# Patient Record
Sex: Female | Born: 1975 | Race: White | Hispanic: No | Marital: Married | Smoking: Never smoker
Health system: Southern US, Community
[De-identification: ages and names within clinical notes are randomized; demographics above are authoritative.]

## PROBLEM LIST (undated history)

## (undated) DIAGNOSIS — F419 Anxiety disorder, unspecified: Secondary | ICD-10-CM

## (undated) DIAGNOSIS — I10 Essential (primary) hypertension: Secondary | ICD-10-CM

## (undated) DIAGNOSIS — M797 Fibromyalgia: Secondary | ICD-10-CM

## (undated) DIAGNOSIS — K529 Noninfective gastroenteritis and colitis, unspecified: Secondary | ICD-10-CM

## (undated) HISTORY — DX: Anxiety disorder, unspecified: F41.9

## (undated) HISTORY — DX: Fibromyalgia: M79.7

## (undated) HISTORY — PX: CHOLECYSTECTOMY: SHX55

## (undated) HISTORY — PX: PATELLA FRACTURE SURGERY: SHX735

## (undated) HISTORY — PX: BREAST SURGERY: SHX581

## (undated) HISTORY — DX: Essential (primary) hypertension: I10

## (undated) HISTORY — DX: Noninfective gastroenteritis and colitis, unspecified: K52.9

---

## 2002-09-25 ENCOUNTER — Other Ambulatory Visit: Admission: RE | Admit: 2002-09-25 | Discharge: 2002-09-25 | Payer: Self-pay | Admitting: Obstetrics and Gynecology

## 2003-11-11 ENCOUNTER — Other Ambulatory Visit: Admission: RE | Admit: 2003-11-11 | Discharge: 2003-11-11 | Payer: Self-pay | Admitting: Obstetrics and Gynecology

## 2006-06-06 ENCOUNTER — Ambulatory Visit (HOSPITAL_BASED_OUTPATIENT_CLINIC_OR_DEPARTMENT_OTHER): Admission: RE | Admit: 2006-06-06 | Discharge: 2006-06-07 | Payer: Self-pay | Admitting: Specialist

## 2006-06-06 ENCOUNTER — Encounter (INDEPENDENT_AMBULATORY_CARE_PROVIDER_SITE_OTHER): Payer: Self-pay | Admitting: *Deleted

## 2009-02-24 ENCOUNTER — Ambulatory Visit: Payer: Self-pay | Admitting: Occupational Medicine

## 2009-02-24 DIAGNOSIS — R599 Enlarged lymph nodes, unspecified: Secondary | ICD-10-CM | POA: Insufficient documentation

## 2009-02-24 DIAGNOSIS — L299 Pruritus, unspecified: Secondary | ICD-10-CM | POA: Insufficient documentation

## 2009-09-28 ENCOUNTER — Ambulatory Visit: Payer: Self-pay | Admitting: Family Medicine

## 2009-09-28 DIAGNOSIS — E860 Dehydration: Secondary | ICD-10-CM

## 2009-09-28 DIAGNOSIS — R112 Nausea with vomiting, unspecified: Secondary | ICD-10-CM

## 2009-09-28 DIAGNOSIS — K5289 Other specified noninfective gastroenteritis and colitis: Secondary | ICD-10-CM

## 2010-01-05 ENCOUNTER — Encounter: Payer: Self-pay | Admitting: Family Medicine

## 2010-02-03 ENCOUNTER — Ambulatory Visit: Payer: Self-pay | Admitting: Family Medicine

## 2010-02-03 DIAGNOSIS — F411 Generalized anxiety disorder: Secondary | ICD-10-CM | POA: Insufficient documentation

## 2010-02-03 DIAGNOSIS — IMO0001 Reserved for inherently not codable concepts without codable children: Secondary | ICD-10-CM

## 2010-02-03 DIAGNOSIS — G43009 Migraine without aura, not intractable, without status migrainosus: Secondary | ICD-10-CM | POA: Insufficient documentation

## 2010-02-04 ENCOUNTER — Encounter: Payer: Self-pay | Admitting: Family Medicine

## 2010-02-04 LAB — CONVERTED CEMR LAB: Vit D, 25-Hydroxy: 36 ng/mL (ref 30–89)

## 2010-02-10 LAB — CONVERTED CEMR LAB
ALT: 43 units/L — ABNORMAL HIGH (ref 0–35)
Albumin: 4.4 g/dL (ref 3.5–5.2)
Alkaline Phosphatase: 71 units/L (ref 39–117)
BUN: 14 mg/dL (ref 6–23)
Basophils Relative: 0.5 % (ref 0.0–3.0)
Calcium: 9.8 mg/dL (ref 8.4–10.5)
Eosinophils Relative: 1.2 % (ref 0.0–5.0)
GFR calc non Af Amer: 114.5 mL/min (ref >60)
Glucose, Bld: 91 mg/dL (ref 70–99)
Lymphocytes Relative: 21.9 % (ref 12.0–46.0)
Neutrophils Relative %: 70.3 % (ref 43.0–77.0)
Sed Rate: 10 mm/hr (ref 0–22)
TSH: 0.71 microintl units/mL (ref 0.35–5.50)
Total Protein: 7.2 g/dL (ref 6.0–8.3)

## 2010-02-12 ENCOUNTER — Encounter: Payer: Self-pay | Admitting: Family Medicine

## 2010-02-23 ENCOUNTER — Ambulatory Visit: Payer: Self-pay | Admitting: Family Medicine

## 2010-02-23 DIAGNOSIS — E876 Hypokalemia: Secondary | ICD-10-CM

## 2010-04-03 ENCOUNTER — Ambulatory Visit: Payer: Self-pay | Admitting: Family Medicine

## 2010-04-03 DIAGNOSIS — I10 Essential (primary) hypertension: Secondary | ICD-10-CM | POA: Insufficient documentation

## 2010-04-17 ENCOUNTER — Ambulatory Visit: Payer: Self-pay | Admitting: Family Medicine

## 2010-04-28 ENCOUNTER — Encounter: Payer: Self-pay | Admitting: Family Medicine

## 2010-05-01 ENCOUNTER — Telehealth: Payer: Self-pay | Admitting: Family Medicine

## 2010-05-12 ENCOUNTER — Ambulatory Visit: Payer: Self-pay | Admitting: Family Medicine

## 2010-05-12 DIAGNOSIS — J019 Acute sinusitis, unspecified: Secondary | ICD-10-CM | POA: Insufficient documentation

## 2010-05-12 DIAGNOSIS — S93409A Sprain of unspecified ligament of unspecified ankle, initial encounter: Secondary | ICD-10-CM | POA: Insufficient documentation

## 2010-06-04 ENCOUNTER — Encounter: Payer: Self-pay | Admitting: Family Medicine

## 2010-06-29 ENCOUNTER — Telehealth: Payer: Self-pay | Admitting: Family Medicine

## 2010-08-04 ENCOUNTER — Telehealth: Payer: Self-pay | Admitting: Family Medicine

## 2010-08-11 ENCOUNTER — Ambulatory Visit: Payer: Self-pay | Admitting: Family Medicine

## 2010-08-11 DIAGNOSIS — M549 Dorsalgia, unspecified: Secondary | ICD-10-CM | POA: Insufficient documentation

## 2010-08-11 DIAGNOSIS — N63 Unspecified lump in unspecified breast: Secondary | ICD-10-CM | POA: Insufficient documentation

## 2010-08-11 LAB — CONVERTED CEMR LAB
Glucose, Urine, Semiquant: NEGATIVE
Protein, U semiquant: NEGATIVE
Specific Gravity, Urine: 1.01
WBC Urine, dipstick: NEGATIVE
pH: 8.5

## 2010-08-13 ENCOUNTER — Telehealth (INDEPENDENT_AMBULATORY_CARE_PROVIDER_SITE_OTHER): Payer: Self-pay | Admitting: *Deleted

## 2010-08-19 ENCOUNTER — Encounter
Admission: RE | Admit: 2010-08-19 | Discharge: 2010-08-19 | Payer: Self-pay | Source: Home / Self Care | Admitting: Family Medicine

## 2010-08-28 ENCOUNTER — Telehealth (INDEPENDENT_AMBULATORY_CARE_PROVIDER_SITE_OTHER): Payer: Self-pay | Admitting: *Deleted

## 2010-09-03 ENCOUNTER — Encounter: Payer: Self-pay | Admitting: Family Medicine

## 2010-09-08 ENCOUNTER — Telehealth (INDEPENDENT_AMBULATORY_CARE_PROVIDER_SITE_OTHER): Payer: Self-pay | Admitting: *Deleted

## 2010-09-30 ENCOUNTER — Telehealth: Payer: Self-pay | Admitting: Family Medicine

## 2010-10-13 NOTE — Assessment & Plan Note (Signed)
Summary: discuss discontinued med/cbs   Vital Signs:  Patient profile:   35 year old female Height:      61 inches Weight:      213 pounds Temp:     98.4 degrees F oral Pulse rate:   86 / minute BP sitting:   160 / 120  (left arm)  Vitals Entered By: Jeremy Johann CMA (April 03, 2010 12:56 PM) CC: discuss changing med   History of Present Illness: Pt here to discuss anxiety --since stopping muscle relaxer her anxiety has gotten worse again.  She used to be on Klonopin but that was d/c when muscle relaxer started.    Current Medications (verified): 1)  Benadryl 25 Mg Caps (Diphenhydramine Hcl) .... Twice 2)  Hydrochlorothiazide 25 Mg Tabs (Hydrochlorothiazide) .Marland Kitchen.. 1 Tab By Mouth Once Daily 3)  Alprazolam 0.25 Mg Tabs (Alprazolam) .... As Needed. 4)  Mirena 20 Mcg/24hr Iud (Levonorgestrel) 5)  Savella 50 Mg Tabs (Milnacipran Hcl) .Marland Kitchen.. 1 By Mouth Bid 6)  Toprol Xl 100 Mg Xr24h-Tab (Metoprolol Succinate) .Marland Kitchen.. 1 By Mouth Once Daily 7)  Klonopin 0.5 Mg Tabs (Clonazepam) .Marland Kitchen.. 1 By Mouth Three Times A Day  Allergies (verified): 1)  ! Penicillin 2)  ! * Dilaudid 3)  ! Codeine  Past History:  Past medical, surgical, family and social histories (including risk factors) reviewed for relevance to current acute and chronic problems.  Past Medical History: high blood pressure anxiety migraines fibromyalgia Hypertension  Past Surgical History: Reviewed history from 02/03/2010 and no changes required. 1996 - femur and kneecap insert and removal of hardware 2003 - gallbladder removal 2007 - breast reduction 2010--Lasik   Family History: Reviewed history from 02/03/2010 and no changes required. mother, sister and brother alive and healthy father - arthritis, asbestosis, high blood pressure and heart murmur Family History of Arthritis Family History of Suicide attempt-- successful Family History of Skin cancer MGF--non hodkins lymphoma Family History Lung cancer Family  History Breast cancer-- Paunts x2 Family History Hypertension Family History Psychiatric care Family History Depression Family History of Anxiety F-- asbestosis  Social History: Reviewed history from 02/24/2009 and no changes required. denies smoking drinks 1 drink per week denies recreational drug use  Review of Systems      See HPI  Physical Exam  General:  Well-developed,well-nourished,in no acute distress; alert,appropriate and cooperative throughout examination Neck:  No deformities, masses, or tenderness noted. Lungs:  Normal respiratory effort, chest expands symmetrically. Lungs are clear to auscultation, no crackles or wheezes. Heart:  normal rate and no murmur.   Psych:  Cognition and judgment appear intact. Alert and cooperative with normal attention span and concentration. No apparent delusions, illusions, hallucinations   Impression & Recommendations:  Problem # 1:  ANXIETY STATE, UNSPECIFIED (ICD-300.00) Assessment Deteriorated  Her updated medication list for this problem includes:    Alprazolam 0.25 Mg Tabs (Alprazolam) .Marland Kitchen... As needed.    Klonopin 0.5 Mg Tabs (Clonazepam) .Marland Kitchen... 1 by mouth three times a day  Discussed medication use and relaxation techniques.   Problem # 2:  FIBROMYALGIA (ICD-729.1) Assessment: Improved con't savella  Problem # 3:  HYPERTENSION (ICD-401.9)  Her updated medication list for this problem includes:    Hydrochlorothiazide 25 Mg Tabs (Hydrochlorothiazide) .Marland Kitchen... 1 tab by mouth once daily    Toprol Xl 100 Mg Xr24h-tab (Metoprolol succinate) .Marland Kitchen... 1 by mouth once daily  BP today: 160/120 Prior BP: 130/84 (02/23/2010)  Labs Reviewed: K+: 3.7 (02/03/2010) Creat: : 0.6 (02/03/2010)     Complete  Medication List: 1)  Benadryl 25 Mg Caps (Diphenhydramine hcl) .... Twice 2)  Hydrochlorothiazide 25 Mg Tabs (Hydrochlorothiazide) .Marland Kitchen.. 1 tab by mouth once daily 3)  Alprazolam 0.25 Mg Tabs (Alprazolam) .... As needed. 4)  Mirena 20  Mcg/24hr Iud (Levonorgestrel) 5)  Savella 50 Mg Tabs (Milnacipran hcl) .Marland Kitchen.. 1 by mouth bid 6)  Toprol Xl 100 Mg Xr24h-tab (Metoprolol succinate) .Marland Kitchen.. 1 by mouth once daily 7)  Klonopin 0.5 Mg Tabs (Clonazepam) .Marland Kitchen.. 1 by mouth three times a day  Patient Instructions: 1)  Please schedule a follow-up appointment in 2 weeks.  Prescriptions: KLONOPIN 0.5 MG TABS (CLONAZEPAM) 1 by mouth three times a day  #90 x 0   Entered and Authorized by:   Loreen Freud DO   Signed by:   Loreen Freud DO on 04/03/2010   Method used:   Print then Give to Patient   RxID:   [id number] TOPROL XL 100 MG XR24H-TAB (METOPROLOL SUCCINATE) 1 by mouth once daily  #30 x 2   Entered and Authorized by:   Loreen Freud DO   Signed by:   Loreen Freud DO on 04/03/2010   Method used:   Electronically to        [facility] # [phone]* (retail)       [address] [place], [place]  [address]       Ph: [phone]       Fax: [phone]   RxID:   [id number]

## 2010-10-13 NOTE — Progress Notes (Signed)
Summary: refill  Phone Note Refill Request Message from:  Fax from Pharmacy on May 01, 2010 4:46 PM  Refills Requested: Medication #1:  KLONOPIN 0.5 MG TABS 1 by mouth three times a day. [facility] - [facility] [phone]  Initial call taken by: Okey Regal Spring,  May 01, 2010 4:47 PM  Follow-up for Phone Call        LAST ov 04-17-10, LAST FILLED 04-03-10..........Marland KitchenFelecia Deloach CMA  May 01, 2010 5:09 PM   Additional Follow-up for Phone Call Additional follow up Details #1::        refill x1  1 refills Additional Follow-up by: Loreen Freud DO,  May 01, 2010 5:23 PM    Prescriptions: KLONOPIN 0.5 MG TABS (CLONAZEPAM) 1 by mouth three times a day  #90 x 1   Entered by:   Jeremy Johann CMA   Authorized by:   Loreen Freud DO   Signed by:   Jeremy Johann CMA on 05/04/2010   Method used:   Printed then faxed to ...       [facility] [phone]* (retail)       [address]       [place], [place]  [address]       Ph: [phone]       Fax: [phone]   RxID:   [id number]

## 2010-10-13 NOTE — Letter (Signed)
Summary: Health Screening/Blueprint for Wellness  Health Screening/Blueprint for Wellness   Imported By: Lanelle Bal 02/11/2010 12:17:23  _____________________________________________________________________  External Attachment:    Type:   Image     Comment:   External Document

## 2010-10-13 NOTE — Progress Notes (Signed)
Summary: Refill Request  Phone Note Refill Request Call back at [phone] Message from:  Pharmacy on June 29, 2010 11:23 AM  Refills Requested: Medication #1:  KLONOPIN 0.5 MG TABS 1 by mouth three times a day   Dosage confirmed as above?Dosage Confirmed   Brand Name Necessary? No   Supply Requested: 1 month   [facility]  Next Appointment Scheduled: 11.29.11 Initial call taken by: Harold Barban,  June 29, 2010 11:24 AM  Follow-up for Phone Call        Please advise. Lucious Groves CMA  June 29, 2010 11:35 AM   Additional Follow-up for Phone Call Additional follow up Details #1::        refill x1 month Additional Follow-up by: Loreen Freud DO,  June 29, 2010 11:56 AM    Prescriptions: KLONOPIN 0.5 MG TABS (CLONAZEPAM) 1 by mouth three times a day  #90 x 0   Entered by:   Almeta Monas CMA (AAMA)   Authorized by:   Loreen Freud DO   Signed by:   Almeta Monas CMA (AAMA) on 06/29/2010   Method used:   Printed then faxed to ...       [facility] [phone]* (retail)       [address]       [place], [place]  [address]       Ph: [phone]       Fax: [phone]   RxID:   [id number]  Rx faxed/pt aware..... Almeta Monas CMA Duncan Dull)  June 29, 2010 2:35 PM

## 2010-10-13 NOTE — Assessment & Plan Note (Signed)
Summary: VOMITING & DIZZINESS x last night rm 2   Vital Signs:  Patient Profile:   35 Years Old Female CC:      vomiting. pain, dizziness x last night Height:     65 inches Weight:      212 pounds O2 Sat:      100 % O2 treatment:    Room Air Temp:     98.0 degrees F oral Pulse rate:   123 / minute Pulse rhythm:   irregular Resp:     16 per minute BP sitting:   169 / 111  (right arm) Cuff size:   regular  Vitals Entered By: Areta Haber CMA (September 28, 2009 1:37 PM)                  Current Allergies (reviewed today): ! PENICILLIN ! * DILAUDID ! CODEINE   History of Present Illness History from: patient Chief Complaint: vomiting. pain, dizziness x last night History of Present Illness: h/o migraines comes complaining of headache nausea vomitting and diarrhea since last night. Total 6 large vomitting no blood, last emesis 2 hours ago. Has had 4 small diarrhea episodes no blood, pus or tenesms. No vaginaldischarge or pelvic pain. Today feeling very weak and had general body aches. Headache is getting better and declines pain medication here. Denies disuria or abdominal pain. No fever. There has been sick contacts at work with vomitting and diarrhea during last week. Has had BTL.   Current Problems: GASTROENTERITIS (ICD-558.9) DEHYDRATION (ICD-276.51) NAUSEA AND VOMITING (ICD-787.01) PRURITUS (ICD-698.9) CERVICAL LYMPHADENOPATHY (ICD-785.6)   Current Meds BENADRYL 25 MG CAPS (DIPHENHYDRAMINE HCL) twice * ZANAFLEX once a day HYDROCHLOROTHIAZIDE 25 MG TABS (HYDROCHLOROTHIAZIDE) 1 tab by mouth once daily CIPROFLOXACIN HCL 500 MG TABS (CIPROFLOXACIN HCL) 1 tab by mouth two times a day for 5 days METOCLOPRAMIDE HCL 10 MG TABS (METOCLOPRAMIDE HCL) 1 tab by mouth three times a day as needed for nausea and vomitting as needed  REVIEW OF SYSTEMS Constitutional Symptoms      Denies fever, chills, night sweats, weight loss, weight gain, and fatigue.  Eyes       Denies  change in vision, eye pain, eye discharge, glasses, contact lenses, and eye surgery. Ear/Nose/Throat/Mouth       Denies hearing loss/aids, change in hearing, ear pain, ear discharge, dizziness, frequent runny nose, frequent nose bleeds, sinus problems, sore throat, hoarseness, and tooth pain or bleeding.  Respiratory       Denies dry cough, productive cough, wheezing, shortness of breath, asthma, bronchitis, and emphysema/COPD.  Cardiovascular       Denies murmurs, chest pain, and tires easily with exhertion.    Gastrointestinal       Complains of nausea/vomiting.      Denies stomach pain, diarrhea, constipation, blood in bowel movements, and indigestion. Genitourniary       Denies painful urination, kidney stones, and loss of urinary control. Neurological       Denies paralysis, seizures, and fainting/blackouts. Musculoskeletal       Denies muscle pain, joint pain, joint stiffness, decreased range of motion, redness, swelling, muscle weakness, and gout.  Skin       Denies bruising, unusual mles/lumps or sores, and hair/skin or nail changes.  Psych       Denies mood changes, temper/anger issues, anxiety/stress, speech problems, depression, and sleep problems. Other Comments: dizzy, pain x last night.   Past History:  Past Medical History: Last updated: 02/24/2009 high blood pressure anxiety migraines fibromyalgia  Past Surgical History: Last updated: 02/24/2009 1996 - femur and kneecap insert and removal of hardware 2003 - gallbladder removal 2007 - breast reduction  Family History: Last updated: 02/24/2009 mother, sister and brother alive and healthy father - arthritis, asbestosis, high blood pressure and heart murmur  Social History: Last updated: 02/24/2009 denies smoking drinks 1 drink per week denies recreational drug use Physical Exam General appearance: obese, well developed, well nourished, lying in bed. Head: normocephalic, atraumatic Ears: normal, no  lesions or deformities Nasal: mucosa pink, nonedematous, no septal deviation, turbinates normal Oral/Pharynx: tongue normal, posterior pharynx without erythema or exudate, moist mucus membranes but dry lips Neck: neck supple,  trachea midline, no masses Chest/Lungs: no rales, wheezes, or rhonchi bilateral, breath sounds equal without effort Abdomen: obese, soft non tender, no guarding, no rebound, non distended impress mildly increased bowel sounds. GU: normal Neurological: grossly intact and non-focal Back: No CVT Skin: multiple tatoo no rashes Assessment New Problems: GASTROENTERITIS (ICD-558.9) DEHYDRATION (ICD-276.51) NAUSEA AND VOMITING (ICD-787.01)   Plan New Medications/Changes: METOCLOPRAMIDE HCL 10 MG TABS (METOCLOPRAMIDE HCL) 1 tab by mouth three times a day as needed for nausea and vomitting as needed  #15 x 0, 09/28/2009, Meghin Thivierge Moreno-Coll  MD CIPROFLOXACIN HCL 500 MG TABS (CIPROFLOXACIN HCL) 1 tab by mouth two times a day for 5 days  #10 x 0, 09/28/2009, Zack Crager Moreno-Coll  MD  New Orders: Zofran 1mg . injection [J2405] Admin of Therapeutic Inj  intramuscular or subcutaneous [96372] Est. Patient Level III [56213]  The patient and/or caregiver has been counseled thoroughly with regard to medications prescribed including dosage, schedule, interactions, rationale for use, and possible side effects and they verbalize understanding.  Diagnoses and expected course of recovery discussed and will return if not improved as expected or if the condition worsens. Patient and/or caregiver verbalized understanding.  Prescriptions: METOCLOPRAMIDE HCL 10 MG TABS (METOCLOPRAMIDE HCL) 1 tab by mouth three times a day as needed for nausea and vomitting as needed  #15 x 0   Entered and Authorized by:   Sharin Grave  MD   Signed by:   Sharin Grave  MD on 09/28/2009   Method used:   Print then Give to Patient   RxID:   [id number] CIPROFLOXACIN HCL 500 MG TABS (CIPROFLOXACIN  HCL) 1 tab by mouth two times a day for 5 days  #10 x 0   Entered and Authorized by:   Sharin Grave  MD   Signed by:   Sharin Grave  MD on 09/28/2009   Method used:   Print then Give to Patient   RxID:   [id number]   Patient Instructions: 1)  I think you probably have gastroenteritis infection. 2)  Your urine test also have some abnormal results making it possible you have a urine infection. 3)  Your exam shows signs of dehydration. As you are tolerating fluids well is appropriate that you continue oral hydration with electrolite solutions as home. (Rehadration salts) this is over the counter.  4)  Take the antibiotic as prescribed. Have your blood pressure and urine rechecked in 2-3 weeks by your primary doctor. 5)  Take the prescribed medication for nausea as needed. 6)  The main problem with gastroentereritis is dehydration. Drink plenty of fluids and take solids as you feel better. If you are unable to keep anything down and/or you show signs of dehydration( dry cracked lips, lack of tears, not urinating, very sleepy) go to the emrgency department.   Medication Administration  Injection # 1:  Medication: Zofran 1mg . injection    Diagnosis: NAUSEA AND VOMITING (ICD-787.01)    Route: IM    Site: RUOQ gluteus    Exp Date: 07/14/2011    Lot #: [id number]    Mfr: Baxter    Comments: Administered 4mg     Patient tolerated injection without complications    Given by: Areta Haber CMA (September 28, 2009 2:27 PM)  Medication # 1:    Medication: Zofran 4mg  Tab    Diagnosis: NAUSEA AND VOMITING (ICD-787.01)  Orders Added: 1)  Zofran 1mg . injection [J2405] 2)  Admin of Therapeutic Inj  intramuscular or subcutaneous [96372] 3)  Est. Patient Level III [62952]

## 2010-10-13 NOTE — Progress Notes (Signed)
Summary: Migraine Diet Info Sheet Brought by Patient  Migraine Diet Info Sheet Brought by Patient   Imported By: Lanelle Bal 02/11/2010 12:16:22  _____________________________________________________________________  External Attachment:    Type:   Image     Comment:   External Document

## 2010-10-13 NOTE — Consult Note (Signed)
Summary: [facility]  [facility]   Imported By: Lanelle Bal 02/23/2010 11:04:02  _____________________________________________________________________  External Attachment:    Type:   Image     Comment:   External Document

## 2010-10-13 NOTE — Progress Notes (Signed)
Summary: refill  Phone Note Refill Request Message from:  Fax from Pharmacy on August 04, 2010 4:07 PM  Refills Requested: Medication #1:  KLONOPIN 0.5 MG TABS 1 by [facility] [phone] - tel [phone]  Initial call taken by: Okey Regal Spring,  August 04, 2010 4:08 PM  Follow-up for Phone Call        last seen 05/12/10 and filled 10/17/1.Marland KitchenMarland KitchenPlease advise.......Marland KitchenAlmeta Monas CMA Duncan Dull)  August 04, 2010 4:32 PM .  Additional Follow-up for Phone Call Additional follow up Details #1::        refill x1 Additional Follow-up by: Loreen Freud DO,  August 04, 2010 5:16 PM    Prescriptions: KLONOPIN 0.5 MG TABS (CLONAZEPAM) 1 by mouth three times a day  #90 x 0   Entered by:   Jeremy Johann CMA   Authorized by:   Loreen Freud DO   Signed by:   Jeremy Johann CMA on 08/05/2010   Method used:   Printed then faxed to ...       [facility] [phone]* (retail)       [address]       [place], [place]  [address]       Ph: [phone]       Fax: [phone]   RxID:   [id number]

## 2010-10-13 NOTE — Assessment & Plan Note (Signed)
Summary: SINUS INFECTION??/HURT ANKLE//KN   Vital Signs:  Patient profile:   35 year old female Weight:      219.6 pounds O2 Sat:      100 % on Room air Temp:     98.4 degrees F oral Pulse rate:   81 / minute Pulse rhythm:   regular BP sitting:   150 / 98  (left arm)  Vitals Entered By: Almeta Monas CMA (AAMA) (May 12, 2010 10:08 AM)  O2 Flow:  Room air CC: c/o cough, sinus pressure and green nasal drainage, URI symptoms   History of Present Illness:       This is a 35 year old woman who presents with URI symptoms.  The symptoms began 4 weeks ago.  The patient complains of nasal congestion, purulent nasal discharge, sore throat, dry cough, and sick contacts, but denies earache.  The patient denies fever, low-grade fever (<100.5 degrees), fever of 100.5-103 degrees, fever of 103.1-104 degrees, fever to >104 degrees, stiff neck, dyspnea, wheezing, rash, vomiting, diarrhea, use of an antipyretic, and response to antipyretic.  The patient also reports sneezing and headache.  The patient denies itchy watery eyes, itchy throat, seasonal symptoms, response to antihistamine, muscle aches, and severe fatigue.  The patient denies the following risk factors for Strep sinusitis: unilateral facial pain, unilateral nasal discharge, poor response to decongestant, double sickening, tooth pain, Strep exposure, tender adenopathy, and absence of cough. Pt only taking benadryl.      Injury      The patient also presents with An injury.  The symptoms began 1 week ago.  pt twisted r ankly walking --- pain since but only with stairs.  Pt was wearing heels and con't to after incident.  The patient reports injury to the right ankle, but denies injury to the head, face, neck, left arm, right arm, left elbow, right elbow, left forearm, right forearm, chest, back, abdomen, left hip, right hip, left thigh, right thigh, left knee, right knee, left leg, right leg, left ankle, left foot, and right foot.  The patient  also reports tenderness.  The patient denies swelling, redness, increased warmth deformity, blood loss, numbness, weakness, loss of sensation, coolness of extremity, and loss of consciousness.  The patient denies the following risk factors for significant bleeding: aspirin use, anticoagulant use, and history of bleeding disorder.    Current Medications (verified): 1)  Benadryl 25 Mg Caps (Diphenhydramine Hcl) .... Twice 2)  Hydrochlorothiazide 25 Mg Tabs (Hydrochlorothiazide) .Marland Kitchen.. 1 Tab By Mouth Once Daily 3)  Alprazolam 0.25 Mg Tabs (Alprazolam) .... As Needed. 4)  Mirena 20 Mcg/24hr Iud (Levonorgestrel) 5)  Savella 50 Mg Tabs (Milnacipran Hcl) .Marland Kitchen.. 1 By Mouth Bid 6)  Toprol Xl 100 Mg Xr24h-Tab (Metoprolol Succinate) .Marland Kitchen.. 1 By Mouth Once Daily 7)  Klonopin 0.5 Mg Tabs (Clonazepam) .Marland Kitchen.. 1 By Mouth Three Times A Day 8)  Chlorpromazine Hcl 10 Mg Tabs (Chlorpromazine Hcl) .Marland Kitchen.. 1 By Mouth As Needed Max of 5 Per Day 9)  Biaxin Xl 500 Mg Xr24h-Tab (Clarithromycin) .... 2 By Mouth Once Daily 10)  Flonase 50 Mcg/act Susp (Fluticasone Propionate) .... 2 Spray Each Nostril Once Daily  Allergies (verified): 1)  ! Penicillin 2)  ! * Dilaudid 3)  ! Codeine  Past History:  Past Medical History: Last updated: 04/03/2010 high blood pressure anxiety migraines fibromyalgia Hypertension  Past Surgical History: Last updated: 02/03/2010 1996 - femur and kneecap insert and removal of hardware 2003 - gallbladder removal 2007 - breast reduction 2010--Lasik  Family History: Last updated: 02/03/2010 mother, sister and brother alive and healthy father - arthritis, asbestosis, high blood pressure and heart murmur Family History of Arthritis Family History of Suicide attempt-- successful Family History of Skin cancer MGF--non hodkins lymphoma Family History Lung cancer Family History Breast cancer-- Paunts x2 Family History Hypertension Family History Psychiatric care Family History  Depression Family History of Anxiety F-- asbestosis  Social History: Last updated: 02/24/2009 denies smoking drinks 1 drink per week denies recreational drug use  Risk Factors: Alcohol Use: <1 (02/03/2010) Caffeine Use: 3 (02/03/2010) Exercise: yes (02/03/2010)  Risk Factors: Smoking Status: never (02/03/2010)  Family History: Reviewed history from 02/03/2010 and no changes required. mother, sister and brother alive and healthy father - arthritis, asbestosis, high blood pressure and heart murmur Family History of Arthritis Family History of Suicide attempt-- successful Family History of Skin cancer MGF--non hodkins lymphoma Family History Lung cancer Family History Breast cancer-- Paunts x2 Family History Hypertension Family History Psychiatric care Family History Depression Family History of Anxiety F-- asbestosis  Social History: Reviewed history from 02/24/2009 and no changes required. denies smoking drinks 1 drink per week denies recreational drug use  Review of Systems      See HPI  Physical Exam  General:  Well-developed,well-nourished,in no acute distress; alert,appropriate and cooperative throughout examination Ears:  External ear exam shows no significant lesions or deformities.  Otoscopic examination reveals clear canals, tympanic membranes are intact bilaterally without bulging, retraction, inflammation or discharge. Hearing is grossly normal bilaterally. Nose:  L frontal sinus tenderness, L maxillary sinus tenderness, R frontal sinus tenderness, and R maxillary sinus tenderness.   Mouth:  pharyngeal erythema and postnasal drip.   Neck:  No deformities, masses, or tenderness noted. Lungs:  Normal respiratory effort, chest expands symmetrically. Lungs are clear to auscultation, no crackles or wheezes. Heart:  Normal rate and regular rhythm. S1 and S2 normal without gallop, murmur, click, rub or other extra sounds. Extremities:  R ankle tenderness lat  mall no swelling or ecchymosis Cervical Nodes:  No lymphadenopathy noted Psych:  Cognition and judgment appear intact. Alert and cooperative with normal attention span and concentration. No apparent delusions, illusions, hallucinations   Impression & Recommendations:  Problem # 1:  SINUSITIS - ACUTE-NOS (ICD-461.9)  Instructed on treatment. Call if symptoms persist or worsen.   Her updated medication list for this problem includes:    Biaxin Xl 500 Mg Xr24h-tab (Clarithromycin) .Marland Kitchen... 2 by mouth once daily    Flonase 50 Mcg/act Susp (Fluticasone propionate) .Marland Kitchen... 2 spray each nostril once daily  Problem # 2:  ANKLE SPRAIN, RIGHT (ICD-845.00)  Instructed to use a compression wrap, elevate the affected area, apply ICE for 20 minutes every hour while awake for next 3 days, and rest. Start physical therapy as directed and recheck in 10-14 days if no improvement, sooner if worse.  Orders: Ankle / Wrist Splint (A4570)  Problem # 3:  HYPERTENSION (ICD-401.9)  high today pt drank energy drink this am---she doesn't normally do this---advised pt not to drink them any more keep 3 month f/u Her updated medication list for this problem includes:    Hydrochlorothiazide 25 Mg Tabs (Hydrochlorothiazide) .Marland Kitchen... 1 tab by mouth once daily    Toprol Xl 100 Mg Xr24h-tab (Metoprolol succinate) .Marland Kitchen... 1 by mouth once daily  BP today: 150/98 Prior BP: 110/78 (04/17/2010)  Labs Reviewed: K+: 3.7 (02/03/2010) Creat: : 0.6 (02/03/2010)     Complete Medication List: 1)  Benadryl 25 Mg Caps (Diphenhydramine hcl) .... Twice  2)  Hydrochlorothiazide 25 Mg Tabs (Hydrochlorothiazide) .Marland Kitchen.. 1 tab by mouth once daily 3)  Alprazolam 0.25 Mg Tabs (Alprazolam) .... As needed. 4)  Mirena 20 Mcg/24hr Iud (Levonorgestrel) 5)  Savella 50 Mg Tabs (Milnacipran hcl) .Marland Kitchen.. 1 by mouth bid 6)  Toprol Xl 100 Mg Xr24h-tab (Metoprolol succinate) .Marland Kitchen.. 1 by mouth once daily 7)  Klonopin 0.5 Mg Tabs (Clonazepam) .Marland Kitchen.. 1 by mouth  three times a day 8)  Chlorpromazine Hcl 10 Mg Tabs (Chlorpromazine hcl) .Marland Kitchen.. 1 by mouth as needed max of 5 per day 9)  Biaxin Xl 500 Mg Xr24h-tab (Clarithromycin) .... 2 by mouth once daily 10)  Flonase 50 Mcg/act Susp (Fluticasone propionate) .... 2 spray each nostril once daily  Patient Instructions: 1)  Please schedule a follow-up appointment in 3 months .  Prescriptions: FLONASE 50 MCG/ACT SUSP (FLUTICASONE PROPIONATE) 2 spray each nostril once daily  #1 x 2   Entered and Authorized by:   Loreen Freud DO   Signed by:   Loreen Freud DO on 05/12/2010   Method used:   Electronically to        [facility] # [phone]* (retail)       [address] [place], [place]  [address]       Ph: [phone]       Fax: [phone]   RxID:   [id number] BIAXIN XL 500 MG XR24H-TAB (CLARITHROMYCIN) 2 by mouth once daily  #28 x 0   Entered and Authorized by:   Loreen Freud DO   Signed by:   Loreen Freud DO on 05/12/2010   Method used:   Electronically to        [facility] # [phone]* (retail)       [address] [place], [place]  [address]       Ph: [phone]       Fax: [phone]   RxID:   [id number]

## 2010-10-13 NOTE — Assessment & Plan Note (Signed)
Summary: NEW TO ESTAB/CBS   Vital Signs:  Patient profile:   36 year old female Height:      61 inches Weight:      208 pounds BMI:     39.44 Pulse rate:   92 / minute Pulse rhythm:   regular BP sitting:   142 / 88  (left arm) Cuff size:   regular  Vitals Entered By: Army Fossa CMA (Feb 03, 2010 9:58 AM) CC: Pt here to establish, Pt has chronic pain issues, she would like to discuss with you. Xanax no longer working.   History of Present Illness: Pt here to establish-- she moved to this end of town and needs a new PCP---Pt was seeing rheum for fibromyalgia, neuro for migraines---but no longer goes.   Pt started crying in office.  She is very frustrated with the pain she is in with fibromyalgia.  Pt has tried celexa and lyrica with no relief.   [facility] for migraines with no better.  Pt having headaches everyday.    Preventive Screening-Counseling & Management  Alcohol-Tobacco     Alcohol drinks/day: <1     Alcohol type: all     Smoking Status: never  Caffeine-Diet-Exercise     Caffeine use/day: 3     Does Patient Exercise: yes     Times/week: 3  Current Medications (verified): 1)  Benadryl 25 Mg Caps (Diphenhydramine Hcl) .... Twice 2)  Zanaflex .... Once A Day 3)  Hydrochlorothiazide 25 Mg Tabs (Hydrochlorothiazide) .Marland Kitchen.. 1 Tab By Mouth Once Daily 4)  Baclofen 10 Mg Tabs (Baclofen) .Marland Kitchen.. 1 By Mouth Three Times A Day. 5)  Alprazolam 0.25 Mg Tabs (Alprazolam) .... As Needed. 6)  Mirena 20 Mcg/24hr Iud (Levonorgestrel) 7)  Savella 50 Mg Tabs (Milnacipran Hcl) .Marland Kitchen.. 1 By Mouth Bid 8)  Toprol Xl 25 Mg Xr24h-Tab (Metoprolol Succinate) .Marland Kitchen.. 1 By Mouth Once Daily  Allergies: 1)  ! Penicillin 2)  ! * Dilaudid 3)  ! Codeine  Past History:  Past Medical History: Last updated: 02/24/2009 high blood pressure anxiety migraines fibromyalgia  Family History: Last updated: 02/03/2010 mother, sister and brother alive and healthy father - arthritis,  asbestosis, high blood pressure and heart murmur Family History of Arthritis Family History of Suicide attempt-- successful Family History of Skin cancer MGF--non hodkins lymphoma Family History Lung cancer Family History Breast cancer-- Paunts x2 Family History Hypertension Family History Psychiatric care Family History Depression Family History of Anxiety F-- asbestosis  Social History: Last updated: 02/24/2009 denies smoking drinks 1 drink per week denies recreational drug use  Risk Factors: Alcohol Use: <1 (02/03/2010) Caffeine Use: 3 (02/03/2010) Exercise: yes (02/03/2010)  Risk Factors: Smoking Status: never (02/03/2010)  Past Surgical History: 1996 - femur and kneecap insert and removal of hardware 2003 - gallbladder removal 2007 - breast reduction 2010--Lasik   Family History: Reviewed history from 02/24/2009 and no changes required. mother, sister and brother alive and healthy father - arthritis, asbestosis, high blood pressure and heart murmur Family History of Arthritis Family History of Suicide attempt-- successful Family History of Skin cancer MGF--non hodkins lymphoma Family History Lung cancer Family History Breast cancer-- Paunts x2 Family History Hypertension Family History Psychiatric care Family History Depression Family History of Anxiety F-- asbestosis  Social History: Reviewed history from 02/24/2009 and no changes required. denies smoking drinks 1 drink per week denies recreational drug useSmoking Status:  never Caffeine use/day:  3 Does Patient Exercise:  yes  Review of Systems  See HPI General:  Denies chills, fatigue, fever, loss of appetite, malaise, sleep disorder, sweats, weakness, and weight loss. Eyes:  Denies blurring, discharge, double vision, eye irritation, eye pain, halos, itching, light sensitivity, red eye, vision loss-1 eye, and vision loss-both eyes. ENT:  Denies decreased hearing, difficulty swallowing, ear  discharge, earache, hoarseness, nasal congestion, nosebleeds, postnasal drainage, ringing in ears, sinus pressure, and sore throat. CV:  Denies bluish discoloration of lips or nails, chest pain or discomfort, difficulty breathing at night, difficulty breathing while lying down, fainting, fatigue, leg cramps with exertion, lightheadness, near fainting, palpitations, shortness of breath with exertion, swelling of feet, swelling of hands, and weight gain. Resp:  Denies chest discomfort, chest pain with inspiration, cough, coughing up blood, excessive snoring, hypersomnolence, morning headaches, pleuritic, shortness of breath, sputum productive, and wheezing. GI:  Denies abdominal pain, bloody stools, change in bowel habits, constipation, dark tarry stools, diarrhea, excessive appetite, gas, hemorrhoids, indigestion, loss of appetite, nausea, vomiting, vomiting blood, and yellowish skin color. GU:  Denies abnormal vaginal bleeding, decreased libido, discharge, dysuria, genital sores, hematuria, incontinence, nocturia, urinary frequency, and urinary hesitancy. MS:  Complains of joint pain; denies joint redness, joint swelling, loss of strength, low back pain, mid back pain, muscle aches, muscle , cramps, muscle weakness, stiffness, and thoracic pain. Derm:  Denies changes in color of skin, changes in nail beds, dryness, excessive perspiration, flushing, hair loss, insect bite(s), itching, lesion(s), poor wound healing, and rash. Neuro:  Complains of headaches; denies brief paralysis, difficulty with concentration, disturbances in coordination, falling down, inability to speak, memory loss, numbness, poor balance, seizures, sensation of room spinning, tingling, tremors, visual disturbances, and weakness. Psych:  Complains of anxiety and easily tearful; denies alternate hallucination ( auditory/visual), depression, easily angered, irritability, mental problems, panic attacks, sense of great danger, suicidal  thoughts/plans, thoughts of violence, unusual visions or sounds, and thoughts /plans of harming others. Endo:  Denies cold intolerance, excessive hunger, excessive thirst, excessive urination, heat intolerance, polyuria, and weight change. Heme:  Denies abnormal bruising, bleeding, enlarge lymph nodes, fevers, pallor, and skin discoloration. Allergy:  Denies hives or rash, itching eyes, persistent infections, seasonal allergies, and sneezing.  Physical Exam  General:  Well-developed,well-nourished,in no acute distress; alert,appropriate and cooperative throughout examination Eyes:  vision grossly intact, pupils equal, pupils round, pupils reactive to light, and no injection.   Neck:  No deformities, masses, or tenderness noted. Lungs:  Normal respiratory effort, chest expands symmetrically. Lungs are clear to auscultation, no crackles or wheezes. Heart:  normal rate and no murmur.   Extremities:  No clubbing, cyanosis, edema, or deformity noted with normal full range of motion of all joints.   Neurologic:  alert & oriented X3, cranial nerves II-XII intact, strength normal in all extremities, and gait normal.   Skin:  Intact without suspicious lesions or rashes Cervical Nodes:  No lymphadenopathy noted Psych:  Oriented X3 and normally interactive.     Impression & Recommendations:  Problem # 1:  FIBROMYALGIA (ICD-729.1)  Her updated medication list for this problem includes:    Baclofen 10 Mg Tabs (Baclofen) .Marland Kitchen... 1 by mouth three times a day.  Orders: Physical Therapy Referral (PT) Venipuncture (40102) TLB-B12 + Folate Pnl (72536_64403-K74/QVZ) TLB-BMP (Basic Metabolic Panel-BMET) (80048-METABOL) TLB-CBC Platelet - w/Differential (85025-CBCD) TLB-Hepatic/Liver Function Pnl (80076-HEPATIC) TLB-TSH (Thyroid Stimulating Hormone) (84443-TSH) TLB-Sedimentation Rate (ESR) (85652-ESR) T-Vitamin D (25-Hydroxy) (56387-56433) T-Antinuclear Antib (ANA) (29518-84166)  Problem # 2:  COMMON  MIGRAINE (ICD-346.10)  Orders: Headache Clinic Referral (Headache) Physical Therapy Referral (  PT) Venipuncture (75643) TLB-B12 + Folate Pnl (32951_88416-S06/TKZ) TLB-BMP (Basic Metabolic Panel-BMET) (80048-METABOL) TLB-CBC Platelet - w/Differential (85025-CBCD) TLB-Hepatic/Liver Function Pnl (80076-HEPATIC) TLB-TSH (Thyroid Stimulating Hormone) (84443-TSH) TLB-Sedimentation Rate (ESR) (85652-ESR) T-Vitamin D (25-Hydroxy) (60109-32355) T-Antinuclear Antib (ANA) (73220-25427) Admin of Therapeutic Inj  intramuscular or subcutaneous (06237) Ketorolac-Toradol 15mg  (S2831)  Headache diary reviewed.  Her updated medication list for this problem includes:    Toprol Xl 25 Mg Xr24h-tab (Metoprolol succinate) .Marland Kitchen... 1 by mouth once daily  Problem # 3:  ANXIETY STATE, UNSPECIFIED (ICD-300.00)  Her updated medication list for this problem includes:    Alprazolam 0.25 Mg Tabs (Alprazolam) .Marland Kitchen... As needed.  Orders: Venipuncture (51761) TLB-B12 + Folate Pnl (60737_10626-R48/NIO) TLB-BMP (Basic Metabolic Panel-BMET) (80048-METABOL) TLB-CBC Platelet - w/Differential (85025-CBCD) TLB-Hepatic/Liver Function Pnl (80076-HEPATIC) TLB-TSH (Thyroid Stimulating Hormone) (84443-TSH) TLB-Sedimentation Rate (ESR) (85652-ESR) T-Vitamin D (25-Hydroxy) (27035-00938) T-Antinuclear Antib (ANA) (18299-37169)  Complete Medication List: 1)  Benadryl 25 Mg Caps (Diphenhydramine hcl) .... Twice 2)  Zanaflex  .... Once a day 3)  Hydrochlorothiazide 25 Mg Tabs (Hydrochlorothiazide) .Marland Kitchen.. 1 tab by mouth once daily 4)  Baclofen 10 Mg Tabs (Baclofen) .Marland Kitchen.. 1 by mouth three times a day. 5)  Alprazolam 0.25 Mg Tabs (Alprazolam) .... As needed. 6)  Mirena 20 Mcg/24hr Iud (Levonorgestrel) 7)  Savella 50 Mg Tabs (Milnacipran hcl) .Marland Kitchen.. 1 by mouth bid 8)  Toprol Xl 25 Mg Xr24h-tab (Metoprolol succinate) .Marland Kitchen.. 1 by mouth once daily  Patient Instructions: 1)  Please schedule a follow-up appointment in 2 weeks.    Prescriptions: TOPROL XL 25 MG XR24H-TAB (METOPROLOL SUCCINATE) 1 by mouth once daily  #30 x 2   Entered and Authorized by:   Loreen Freud DO   Signed by:   Loreen Freud DO on 02/03/2010   Method used:   Electronically to        [facility] # [phone]* (retail)       [address] [place], [place]  [address]       Ph: [phone]       Fax: [phone]   RxID:   [id number] SAVELLA 50 MG TABS (MILNACIPRAN HCL) 1 by mouth bid  #60 x 2   Entered and Authorized by:   Loreen Freud DO   Signed by:   Loreen Freud DO on 02/03/2010   Method used:   Print then Give to Patient   RxID:   [id number]    Medication Administration  Injection # 1:    Medication: Ketorolac-Toradol 15mg     Diagnosis: COMMON MIGRAINE (ICD-346.10)    Route: IM    Site: RUOQ gluteus    Exp Date: 10/14/2010    Lot #: [id number]    Mfr: [email or web address]    Comments: RECEIVED 60MG     Patient tolerated injection without complications    Given by: Army Fossa CMA (Feb 03, 2010 11:03 AM)  Orders Added: 1)  Headache Clinic Referral [Headache] 2)  Physical Therapy Referral [PT] 3)  Venipuncture [67341] 4)  TLB-B12 + Folate Pnl [82746_82607-B12/FOL] 5)  TLB-BMP (Basic Metabolic Panel-BMET) [80048-METABOL] 6)  TLB-CBC Platelet - w/Differential [85025-CBCD] 7)  TLB-Hepatic/Liver Function Pnl [80076-HEPATIC] 8)  TLB-TSH (Thyroid Stimulating Hormone) [84443-TSH] 9)  TLB-Sedimentation Rate (ESR) [85652-ESR] 10)  T-Vitamin D (25-Hydroxy) [93790-24097] 11)  T-Antinuclear Antib (ANA) [35329-92426] 12)  Admin of Therapeutic Inj  intramuscular or subcutaneous [96372] 13)  Ketorolac-Toradol 15mg  [J1885] 14)  New Patient Level III [83419]

## 2010-10-13 NOTE — Letter (Signed)
Summary: [facility]  [facility]   Imported By: Lanelle Bal 05/12/2010 08:13:54  _____________________________________________________________________  External Attachment:    Type:   Image     Comment:   External Document

## 2010-10-13 NOTE — Miscellaneous (Signed)
Summary: Orders Update  Clinical Lists Changes  Orders: Added new Referral order of Neurology Referral (Neuro) - Signed 

## 2010-10-13 NOTE — Miscellaneous (Signed)
Summary: Toradol inj  Clinical Lists Changes  Orders: Added new Service order of Ketorolac-Toradol 15mg  [phone]) - Signed Added new Service order of Admin of Therapeutic Inj  intramuscular or subcutaneous ([address]) - Signed      Medication Administration  Injection # 1:    Medication: Ketorolac-Toradol 15mg     Diagnosis: COMMON MIGRAINE (ICD-346.10)    Route: IM    Site: RUOQ gluteus    Exp Date: 11/12/2011    Lot #: [id number]    Mfr: Novaplus    Patient tolerated injection without complications    Given by: Lucious Groves CMA (April 17, 2010 3:38 PM)  Orders Added: 1)  Ketorolac-Toradol 15mg  [J1885] 2)  Admin of Therapeutic Inj  intramuscular or subcutaneous [47829]

## 2010-10-13 NOTE — Assessment & Plan Note (Signed)
Summary: rto 2 weeks.cbs   Vital Signs:  Patient profile:   35 year old female Weight:      209.38 pounds Pulse rate:   90 / minute Pulse rhythm:   regular BP sitting:   130 / 84  (left arm) Cuff size:   regular  Vitals Entered By: Army Fossa CMA (February 23, 2010 10:29 AM) CC: Pt here for 2 week follow up, did not take the klorcon.   History of Present Illness: Pt here for f/u---she is pleased with the way savella is working .   No other complaints.    Allergies: 1)  ! Penicillin 2)  ! * Dilaudid 3)  ! Codeine  Physical Exam  General:  Well-developed,well-nourished,in no acute distress; alert,appropriate and cooperative throughout examination Neck:  No deformities, masses, or tenderness noted. Lungs:  Normal respiratory effort, chest expands symmetrically. Lungs are clear to auscultation, no crackles or wheezes. Heart:  Normal rate and regular rhythm. S1 and S2 normal without gallop, murmur, click, rub or other extra sounds. Extremities:  No clubbing, cyanosis, edema, or deformity noted with normal full range of motion of all joints.   Psych:  Oriented X3 and normally interactive.     Impression & Recommendations:  Problem # 1:  HYPOKALEMIA (ICD-276.8)  Orders: Venipuncture (29562) TLB-BMP (Basic Metabolic Panel-BMET) (80048-METABOL)  Problem # 2:  FIBROMYALGIA (ICD-729.1) CON'T SAVELLA  The following medications were removed from the medication list:    Baclofen 10 Mg Tabs (Baclofen) .Marland Kitchen... 1 by mouth three times a day.  Problem # 3:  COMMON MIGRAINE (ICD-346.10) per neuro Her updated medication list for this problem includes:    Toprol Xl 50 Mg Xr24h-tab (Metoprolol succinate) .Marland Kitchen... 1 by mouth once daily  Complete Medication List: 1)  Benadryl 25 Mg Caps (Diphenhydramine hcl) .... Twice 2)  Hydrochlorothiazide 25 Mg Tabs (Hydrochlorothiazide) .Marland Kitchen.. 1 tab by mouth once daily 3)  Alprazolam 0.25 Mg Tabs (Alprazolam) .... As needed. 4)  Mirena 20 Mcg/24hr Iud  (Levonorgestrel) 5)  Savella 50 Mg Tabs (Milnacipran hcl) .Marland Kitchen.. 1 by mouth bid 6)  Toprol Xl 50 Mg Xr24h-tab (Metoprolol succinate) .Marland Kitchen.. 1 by mouth once daily Prescriptions: TOPROL XL 50 MG XR24H-TAB (METOPROLOL SUCCINATE) 1 by mouth once daily  #30 x 0   Entered and Authorized by:   Loreen Freud DO   Signed by:   Loreen Freud DO on 02/23/2010   Method used:   Electronically to        [facility] # [phone]* (retail)       [address] [place], [place]  [address]       Ph: [phone]       Fax: [phone]   RxID:   [id number] TOPROL XL 50 MG XR24H-TAB (METOPROLOL SUCCINATE) 1 by mouth once daily  #90 x 3   Entered and Authorized by:   Loreen Freud DO   Signed by:   Loreen Freud DO on 02/23/2010   Method used:   Faxed to ...       [facility] (mail-order)             , [place]         Ph:        Fax: [phone]   RxID:   [id number] SAVELLA 50 MG TABS (MILNACIPRAN HCL) 1 by mouth bid  #180 x 3   Entered and Authorized by:   Myrene Buddy  Lowne DO   Signed by:   Loreen Freud DO on 02/23/2010   Method used:   Faxed to ...       [facility] (mail-order)             , [place]         Ph:        Fax: [phone]   RxID:   [id number]

## 2010-10-13 NOTE — Assessment & Plan Note (Signed)
Summary: rto 3 months/cbs   Vital Signs:  Patient profile:   35 year old female Weight:      230 pounds Pulse rate:   80 / minute Pulse rhythm:   regular BP sitting:   124 / 74  (left arm) Cuff size:   large  Vitals Entered By: Almeta Monas CMA Duncan Dull) (August 11, 2010 4:07 PM) CC: 3 mo f/u c/o sinus infection, lump on the right breast and lbp, URI symptoms   History of Present Illness:       This is a 35 year old woman who presents with URI symptoms.  The symptoms began 3 months ago.  Pt never filled abx after last visit.  The patient complains of nasal congestion, purulent nasal discharge, productive cough, earache, and sick contacts.  The patient denies fever, low-grade fever (<100.5 degrees), fever of 100.5-103 degrees, fever of 103.1-104 degrees, fever to >104 degrees, stiff neck, dyspnea, wheezing, rash, vomiting, diarrhea, use of an antipyretic, and response to antipyretic.  The patient also reports headache.  The patient denies itchy watery eyes, itchy throat, sneezing, seasonal symptoms, response to antihistamine, muscle aches, and severe fatigue.  The patient denies the following risk factors for Strep sinusitis: unilateral facial pain, unilateral nasal discharge, poor response to decongestant, double sickening, tooth pain, Strep exposure, tender adenopathy, and absence of cough.    Pt also c/o LBP for the last 2 weeks.  She is having spasms and is in a lot of pain when she tries to move.  This occurs periodically.    Pt also here for bp check-- no problems with med.  Current Medications (verified): 1)  Benadryl 25 Mg Caps (Diphenhydramine Hcl) .... Twice 2)  Hydrochlorothiazide 25 Mg Tabs (Hydrochlorothiazide) .Marland Kitchen.. 1 Tab By Mouth Once Daily 3)  Alprazolam 0.25 Mg Tabs (Alprazolam) .... As Needed. 4)  Mirena 20 Mcg/24hr Iud (Levonorgestrel) 5)  Savella 50 Mg Tabs (Milnacipran Hcl) .Marland Kitchen.. 1 By Mouth Bid 6)  Toprol Xl 100 Mg Xr24h-Tab (Metoprolol Succinate) .Marland Kitchen.. 1 By Mouth Once  Daily 7)  Klonopin 0.5 Mg Tabs (Clonazepam) .Marland Kitchen.. 1 By Mouth Three Times A Day 8)  Chlorpromazine Hcl 10 Mg Tabs (Chlorpromazine Hcl) .Marland Kitchen.. 1 By Mouth As Needed Max of 5 Per Day 9)  Flonase 50 Mcg/act Susp (Fluticasone Propionate) .... 2 Spray Each Nostril Once Daily 10)  Biaxin Xl 500 Mg Xr24h-Tab (Clarithromycin) .... 2 By Mouth Once Daily  Allergies (verified): 1)  ! Penicillin 2)  ! * Dilaudid 3)  ! Codeine  Past History:  Past Medical History: Last updated: 04/03/2010 high blood pressure anxiety migraines fibromyalgia Hypertension  Past Surgical History: Last updated: 02/03/2010 1996 - femur and kneecap insert and removal of hardware 2003 - gallbladder removal 2007 - breast reduction 2010--Lasik   Family History: Last updated: 02/03/2010 mother, sister and brother alive and healthy father - arthritis, asbestosis, high blood pressure and heart murmur Family History of Arthritis Family History of Suicide attempt-- successful Family History of Skin cancer MGF--non hodkins lymphoma Family History Lung cancer Family History Breast cancer-- Paunts x2 Family History Hypertension Family History Psychiatric care Family History Depression Family History of Anxiety F-- asbestosis  Social History: Last updated: 02/24/2009 denies smoking drinks 1 drink per week denies recreational drug use  Risk Factors: Alcohol Use: <1 (02/03/2010) Caffeine Use: 3 (02/03/2010) Exercise: yes (02/03/2010)  Risk Factors: Smoking Status: never (02/03/2010)  Family History: Reviewed history from 02/03/2010 and no changes required. mother, sister and brother alive and healthy father -  arthritis, asbestosis, high blood pressure and heart murmur Family History of Arthritis Family History of Suicide attempt-- successful Family History of Skin cancer MGF--non hodkins lymphoma Family History Lung cancer Family History Breast cancer-- Paunts x2 Family History Hypertension Family  History Psychiatric care Family History Depression Family History of Anxiety F-- asbestosis  Social History: Reviewed history from 02/24/2009 and no changes required. denies smoking drinks 1 drink per week denies recreational drug use  Review of Systems      See HPI  Physical Exam  General:  Well-developed,well-nourished,in no acute distress; alert,appropriate and cooperative throughout examination Breasts:  R breast mass outer middle quadrantskin/areolae normal and no nipple discharge.   Lungs:  Normal respiratory effort, chest expands symmetrically. Lungs are clear to auscultation, no crackles or wheezes. Msk:  normal ROM, no joint tenderness, no joint swelling, no joint warmth, no redness over joints, no joint deformities, no joint instability, and no crepitation.   Extremities:  No clubbing, cyanosis, edema, or deformity noted with normal full range of motion of all joints.   Neurologic:  alert & oriented X3, strength normal in all extremities, gait normal, and DTRs symmetrical and normal.   Psych:  Oriented X3 and normally interactive.     Impression & Recommendations:  Problem # 1:  SINUSITIS - ACUTE-NOS (ICD-461.9)  Her updated medication list for this problem includes:    Flonase 50 Mcg/act Susp (Fluticasone propionate) .Marland Kitchen... 2 spray each nostril once daily    Biaxin Xl 500 Mg Xr24h-tab (Clarithromycin) .Marland Kitchen... 2 by mouth once daily  Instructed on treatment. Call if symptoms persist or worsen.   Problem # 2:  BREAST MASS, RIGHT (ICD-611.72)  Orders: Radiology Referral (Radiology)  Problem # 3:  HYPERTENSION (ICD-401.9)  Her updated medication list for this problem includes:    Hydrochlorothiazide 25 Mg Tabs (Hydrochlorothiazide) .Marland Kitchen... 1 tab by mouth once daily    Toprol Xl 100 Mg Xr24h-tab (Metoprolol succinate) .Marland Kitchen... 1 by mouth once daily  BP today: 124/74 Prior BP: 150/98 (05/12/2010)  Labs Reviewed: K+: 3.7 (02/03/2010) Creat: : 0.6 (02/03/2010)      Problem # 4:  BACK PAIN (ICD-724.5)  Discussed use of moist heat or ice, modified activities, medications, and stretching/strengthening exercises. Back care instructions given. To be seen in 2 weeks if no improvement; sooner if worsening of symptoms.   Complete Medication List: 1)  Benadryl 25 Mg Caps (Diphenhydramine hcl) .... Twice 2)  Hydrochlorothiazide 25 Mg Tabs (Hydrochlorothiazide) .Marland Kitchen.. 1 tab by mouth once daily 3)  Alprazolam 0.25 Mg Tabs (Alprazolam) .... As needed. 4)  Mirena 20 Mcg/24hr Iud (Levonorgestrel) 5)  Savella 50 Mg Tabs (Milnacipran hcl) .Marland Kitchen.. 1 by mouth bid 6)  Toprol Xl 100 Mg Xr24h-tab (Metoprolol succinate) .Marland Kitchen.. 1 by mouth once daily 7)  Klonopin 0.5 Mg Tabs (Clonazepam) .Marland Kitchen.. 1 by mouth three times a day 8)  Chlorpromazine Hcl 10 Mg Tabs (Chlorpromazine hcl) .Marland Kitchen.. 1 by mouth as needed max of 5 per day 9)  Flonase 50 Mcg/act Susp (Fluticasone propionate) .... 2 spray each nostril once daily 10)  Biaxin Xl 500 Mg Xr24h-tab (Clarithromycin) .... 2 by mouth once daily Prescriptions: BIAXIN XL 500 MG XR24H-TAB (CLARITHROMYCIN) 2 by mouth once daily  #28 x 0   Entered and Authorized by:   Loreen Freud DO   Signed by:   Loreen Freud DO on 08/11/2010   Method used:   Electronically to        [facility] # [phone]* (retail)  [address] [place], [place]  [address]       Ph: [phone]       Fax: [phone]   RxID:   [id number]    Orders Added: 1)  Radiology Referral [Radiology] 2)  Est. Patient Level IV [84[address]]    Laboratory Results   Urine Tests    Routine Urinalysis   Color: lt. yellow Appearance: Clear Glucose: negative   (Normal Range: Negative) Bilirubin: negative   (Normal Range: Negative) Ketone: negative   (Normal Range: Negative) Spec. Gravity: 1.010   (Normal Range: 1.003-1.035) Blood: trace-lysed   (Normal Range: Negative) pH: 8.5   (Normal Range: 5.0-8.0) Protein: negative   (Normal Range:  Negative) Urobilinogen: 0.2   (Normal Range: 0-1) Nitrite: negative   (Normal Range: Negative) Leukocyte Esterace: negative   (Normal Range: Negative)

## 2010-10-13 NOTE — Letter (Signed)
Summary: [facility]  [facility]   Imported By: Lanelle Bal 06/15/2010 13:35:53  _____________________________________________________________________  External Attachment:    Type:   Image     Comment:   External Document

## 2010-10-13 NOTE — Assessment & Plan Note (Signed)
Summary: rto 2 weeks/cbs   Vital Signs:  Patient profile:   35 year old female Height:      61 inches Weight:      210 pounds Temp:     97.9 degrees F oral Pulse rate:   82 / minute BP sitting:   110 / 78  (left arm)  Vitals Entered By: Jeremy Johann CMA (April 17, 2010 3:19 PM) CC: 2 week f/u   History of Present Illness:  Hypertension follow-up      This is a 35 year old woman who presents for Hypertension follow-up.  The patient denies lightheadedness, urinary frequency,  edema, impotence, rash, and fatigue.  The patient denies the following associated symptoms: chest pain, chest pressure, exercise intolerance, dyspnea, palpitations, syncope, leg edema, and pedal edema.  Compliance with medications (by patient report) has been near 100%.  The patient reports that dietary compliance has been good.  Adjunctive measures currently used by the patient include salt restriction.    Pt also having one of her typical migraines---ibuprofen did not work.  Pt has appointment with Neuro next week.  They have actually improved.    Current Medications (verified): 1)  Benadryl 25 Mg Caps (Diphenhydramine Hcl) .... Twice 2)  Hydrochlorothiazide 25 Mg Tabs (Hydrochlorothiazide) .Marland Kitchen.. 1 Tab By Mouth Once Daily 3)  Alprazolam 0.25 Mg Tabs (Alprazolam) .... As Needed. 4)  Mirena 20 Mcg/24hr Iud (Levonorgestrel) 5)  Savella 50 Mg Tabs (Milnacipran Hcl) .Marland Kitchen.. 1 By Mouth Bid 6)  Toprol Xl 100 Mg Xr24h-Tab (Metoprolol Succinate) .Marland Kitchen.. 1 By Mouth Once Daily 7)  Klonopin 0.5 Mg Tabs (Clonazepam) .Marland Kitchen.. 1 By Mouth Three Times A Day  Allergies (verified): 1)  ! Penicillin 2)  ! * Dilaudid 3)  ! Codeine  Past History:  Past medical, surgical, family and social histories (including risk factors) reviewed for relevance to current acute and chronic problems.  Past Medical History: Reviewed history from 04/03/2010 and no changes required. high blood  pressure anxiety migraines fibromyalgia Hypertension  Past Surgical History: Reviewed history from 02/03/2010 and no changes required. 1996 - femur and kneecap insert and removal of hardware 2003 - gallbladder removal 2007 - breast reduction 2010--Lasik   Family History: Reviewed history from 02/03/2010 and no changes required. mother, sister and brother alive and healthy father - arthritis, asbestosis, high blood pressure and heart murmur Family History of Arthritis Family History of Suicide attempt-- successful Family History of Skin cancer MGF--non hodkins lymphoma Family History Lung cancer Family History Breast cancer-- Paunts x2 Family History Hypertension Family History Psychiatric care Family History Depression Family History of Anxiety F-- asbestosis  Social History: Reviewed history from 02/24/2009 and no changes required. denies smoking drinks 1 drink per week denies recreational drug use  Review of Systems      See HPI  Physical Exam  General:  Well-developed,well-nourished,in no acute distress; alert,appropriate and cooperative throughout examination Neck:  No deformities, masses, or tenderness noted. Lungs:  Normal respiratory effort, chest expands symmetrically. Lungs are clear to auscultation, no crackles or wheezes. Heart:  Normal rate and regular rhythm. S1 and S2 normal without gallop, murmur, click, rub or other extra sounds. Psych:  Cognition and judgment appear intact. Alert and cooperative with normal attention span and concentration. No apparent delusions, illusions, hallucinations   Impression & Recommendations:  Problem # 1:  HYPERTENSION (ICD-401.9) Assessment Improved  Her updated medication list for this problem includes:    Hydrochlorothiazide 25 Mg Tabs (Hydrochlorothiazide) .Marland Kitchen... 1 tab by mouth once  daily    Toprol Xl 100 Mg Xr24h-tab (Metoprolol succinate) .Marland Kitchen... 1 by mouth once daily  BP today: 110/78 Prior BP: 160/120  (04/03/2010)  Labs Reviewed: K+: 3.7 (02/03/2010) Creat: : 0.6 (02/03/2010)     Problem # 2:  COMMON MIGRAINE (ICD-346.10) Assessment: Improved  Her updated medication list for this problem includes:    Toprol Xl 100 Mg Xr24h-tab (Metoprolol succinate) .Marland Kitchen... 1 by mouth once daily  Complete Medication List: 1)  Benadryl 25 Mg Caps (Diphenhydramine hcl) .... Twice 2)  Hydrochlorothiazide 25 Mg Tabs (Hydrochlorothiazide) .Marland Kitchen.. 1 tab by mouth once daily 3)  Alprazolam 0.25 Mg Tabs (Alprazolam) .... As needed. 4)  Mirena 20 Mcg/24hr Iud (Levonorgestrel) 5)  Savella 50 Mg Tabs (Milnacipran hcl) .Marland Kitchen.. 1 by mouth bid 6)  Toprol Xl 100 Mg Xr24h-tab (Metoprolol succinate) .Marland Kitchen.. 1 by mouth once daily 7)  Klonopin 0.5 Mg Tabs (Clonazepam) .Marland Kitchen.. 1 by mouth three times a day  Patient Instructions: 1)  Please schedule a follow-up appointment in 4 months .

## 2010-10-13 NOTE — Progress Notes (Signed)
Summary: pain med   Phone Note Call from Patient Call back at Work Phone [phone] Call back at ext [phone]   Caller: Patient Summary of Call: PT left VM that she was recently seen for back pain and advise to use heat and tylenol for the pain. PT now requesting a Rx because they are not helping with the pain. left message to call office to advise per OV 08-11-10 PT to be seen in 2 weeks if no improvement; sooner if worsening of symptoms. ...............Marland KitchenFelecia Deloach CMA  August 13, 2010 3:15 PM   PT aware will continue to try back care instructions and call for f/u if no improvement.............Marland KitchenFelecia Deloach CMA  August 13, 2010 4:26 PM

## 2010-10-15 NOTE — Progress Notes (Signed)
Summary: Clonazepam refill  Phone Note Refill Request Message from:  Fax from Pharmacy on September 30, 2010 1:46 PM  Refills Requested: Medication #1:  KLONOPIN 0.5 MG TABS 1 by mouth three times a day   Last Refilled: 08/28/2010 [id number],  [address], [place], [place]  phone [phone], fax [phone]   qty = 90  Next Appointment Scheduled: none Initial call taken by: Jerolyn Shin,  September 30, 2010 1:47 PM  Follow-up for Phone Call        last seen 08/11/10 and filled 08/28/10.Marland KitchenMarland Kitchenplease advise Follow-up by: Almeta Monas CMA Duncan Dull),  September 30, 2010 3:35 PM  Additional Follow-up for Phone Call Additional follow up Details #1::        refiill x1  3 refills Additional Follow-up by: Loreen Freud DO,  September 30, 2010 8:54 PM    Prescriptions: KLONOPIN 0.5 MG TABS (CLONAZEPAM) 1 by mouth three times a day  #90 x 3   Entered by:   Almeta Monas CMA (AAMA)   Authorized by:   Loreen Freud DO   Signed by:   Almeta Monas CMA (AAMA) on 10/01/2010   Method used:   Printed then faxed to ...       [facility] [phone]* (retail)       [address]       [place], [place]  [address]       Ph: [phone]       Fax: [phone]   RxID:   [id number]

## 2010-10-15 NOTE — Progress Notes (Signed)
Summary: klonopin refill   Phone Note Refill Request Message from:  Fax from Pharmacy on August 28, 2010 8:39 AM  Refills Requested: Medication #1:  KLONOPIN 0.5 MG TABS 1 by mouth three times a day Ivar Bury - fax [phone]  Initial call taken by: Okey Regal [place],  August 28, 2010 8:40 AM  Follow-up for Phone Call        last filled 08/05/10 and seen 08/11/10... please advise Follow-up by: Almeta Monas CMA Duncan Dull),  August 28, 2010 9:10 AM  Additional Follow-up for Phone Call Additional follow up Details #1::        refill x1 Additional Follow-up by: Loreen Freud DO,  August 28, 2010 10:51 AM    Additional Follow-up for Phone Call Additional follow up Details #2::    faxed to [facility].......Marland KitchenDoristine Devoid CMA  August 28, 2010 11:30 AM   Prescriptions: KLONOPIN 0.5 MG TABS (CLONAZEPAM) 1 by mouth three times a day  #90 x 0   Entered by:   Doristine Devoid CMA   Authorized by:   Loreen Freud DO   Signed by:   Doristine Devoid CMA on 08/28/2010   Method used:   Printed then faxed to ...       [facility] # [phone]* (retail)       [address] [place], [place]  [address]       Ph: [phone]       Fax: [phone]   RxID:   [id number]

## 2010-10-15 NOTE — Progress Notes (Signed)
Summary: med refill  Phone Note Refill Request Message from:  Fax from Pharmacy on September 08, 2010 9:07 AM  Refills Requested: Medication #1:  FLONASE 50 MCG/ACT SUSP 2 spray each nostril once daily.   Supply Requested: 90 day   Notes: [facility]  Medication #2:  TOPROL XL 100 MG XR24H-TAB 1 by mouth once daily   Supply Requested: 90 day Initial call taken by: Lucious Groves CMA,  September 08, 2010 9:07 AM    Prescriptions: Aleda Grana 50 MCG/ACT SUSP (FLUTICASONE PROPIONATE) 2 spray each nostril once daily  #1 x 2   Entered by:   Almeta Monas CMA (AAMA)   Authorized by:   Loreen Freud DO   Signed by:   Almeta Monas CMA (AAMA) on 09/08/2010   Method used:   Faxed to ...       [facility] (mail-order)             , [place]         Ph: [phone]       Fax: [phone]   RxID:   [id number] TOPROL XL 100 MG XR24H-TAB (METOPROLOL SUCCINATE) 1 by mouth once daily  #90 x 0   Entered by:   Almeta Monas CMA (AAMA)   Authorized by:   Loreen Freud DO   Signed by:   Almeta Monas CMA (AAMA) on 09/08/2010   Method used:   Faxed to ...       [facility] (mail-order)             , [place]         Ph: [phone]       Fax: [phone]   RxID:   [id number]

## 2010-10-15 NOTE — Letter (Signed)
Summary: [facility]  [facility]   Imported By: Lanelle Bal 09/17/2010 10:06:17  _____________________________________________________________________  External Attachment:    Type:   Image     Comment:   External Document

## 2010-11-06 ENCOUNTER — Telehealth: Payer: Self-pay | Admitting: Family Medicine

## 2010-11-10 NOTE — Progress Notes (Signed)
Summary: Wants Rx phenergan  Phone Note Call from Patient   Caller: Patient Call For: Loreen Freud DO Summary of Call: Spoke with patient and she stated she was in the hospt this week with colitis and possible Norovirus--- Stated she is still sick and was only given a few phenergan, she wanted get an RX of phenergan 25mg  1 by mouth q4 hours as needed called to [facility] at [address] in [place]. I scheduled her a hospt f/u for Wed 2/29 @11am  with Dr.Lowne.....c/b # [id number] please advise  Initial call taken by: Almeta Monas CMA Duncan Dull),  November 06, 2010 3:44 PM  Follow-up for Phone Call        phenergan 25 mg  #20  1 by mouth qid as needed --sent to pharmacy Follow-up by: Loreen Freud DO,  November 06, 2010 4:49 PM  Additional Follow-up for Phone Call Additional follow up Details #1::        pt aware RX sent to the pharmacy... Almeta Monas CMA (AAMA)  November 06, 2010 5:02 PM     New/Updated Medications: PROMETHAZINE HCL 25 MG TABS (PROMETHAZINE HCL) 1 by mouth qid as needed Prescriptions: PROMETHAZINE HCL 25 MG TABS (PROMETHAZINE HCL) 1 by mouth qid as needed  #20 x 0   Entered and Authorized by:   Loreen Freud DO   Signed by:   Loreen Freud DO on 11/06/2010   Method used:   Electronically to        [facility] # [phone]* (retail)       [address] [place], [place]  [address]       Ph: [phone]       Fax: [phone]   RxID:   [id number]

## 2010-11-11 ENCOUNTER — Encounter (INDEPENDENT_AMBULATORY_CARE_PROVIDER_SITE_OTHER): Payer: Self-pay | Admitting: *Deleted

## 2010-11-11 ENCOUNTER — Encounter: Payer: Self-pay | Admitting: Family Medicine

## 2010-11-11 ENCOUNTER — Ambulatory Visit (INDEPENDENT_AMBULATORY_CARE_PROVIDER_SITE_OTHER): Payer: BC Managed Care – PPO | Admitting: Family Medicine

## 2010-11-11 DIAGNOSIS — K589 Irritable bowel syndrome without diarrhea: Secondary | ICD-10-CM | POA: Insufficient documentation

## 2010-11-11 DIAGNOSIS — M25569 Pain in unspecified knee: Secondary | ICD-10-CM | POA: Insufficient documentation

## 2010-11-11 DIAGNOSIS — R110 Nausea: Secondary | ICD-10-CM

## 2010-11-11 DIAGNOSIS — K5289 Other specified noninfective gastroenteritis and colitis: Secondary | ICD-10-CM

## 2010-11-16 ENCOUNTER — Encounter (INDEPENDENT_AMBULATORY_CARE_PROVIDER_SITE_OTHER): Payer: Self-pay | Admitting: *Deleted

## 2010-11-16 ENCOUNTER — Other Ambulatory Visit (INDEPENDENT_AMBULATORY_CARE_PROVIDER_SITE_OTHER): Payer: BC Managed Care – PPO

## 2010-11-16 ENCOUNTER — Other Ambulatory Visit: Payer: Self-pay | Admitting: Family Medicine

## 2010-11-16 DIAGNOSIS — K5289 Other specified noninfective gastroenteritis and colitis: Secondary | ICD-10-CM

## 2010-11-16 LAB — BASIC METABOLIC PANEL
BUN: 12 mg/dL (ref 6–23)
CO2: 25 mEq/L (ref 19–32)
Chloride: 104 mEq/L (ref 96–112)
Creatinine, Ser: 0.6 mg/dL (ref 0.4–1.2)
Glucose, Bld: 92 mg/dL (ref 70–99)

## 2010-11-16 LAB — CBC WITH DIFFERENTIAL/PLATELET
Basophils Absolute: 0.1 10*3/uL (ref 0.0–0.1)
HCT: 35.1 % — ABNORMAL LOW (ref 36.0–46.0)
Monocytes Absolute: 0.7 10*3/uL (ref 0.1–1.0)
Monocytes Relative: 7.3 % (ref 3.0–12.0)
Neutro Abs: 7.2 10*3/uL (ref 1.4–7.7)
Platelets: 386 10*3/uL (ref 150.0–400.0)
RDW: 14 % (ref 11.5–14.6)

## 2010-11-16 LAB — HEPATIC FUNCTION PANEL
ALT: 34 U/L (ref 0–35)
Bilirubin, Direct: 0.1 mg/dL (ref 0.0–0.3)
Total Bilirubin: 0.4 mg/dL (ref 0.3–1.2)

## 2010-11-19 NOTE — Letter (Signed)
Summary: New Patient letter  [facility]  [address] [place], [place] [address]   Phone: [phone]  Fax: [phone]       11/11/2010 MRN: [id number]  [facility] [address] [place], [place]  [address]  [place]  Dear Bethany Montgomery,  Welcome to the Gastroenterology Division at [facility].    You are scheduled to see Dr.  Rob Bunting on December 16, 2010 at 1:30pm on the 3rd floor at [facility], [address]  We ask that you try to arrive at our office 15 minutes prior to your appointment time to allow for check-in.  We would like you to complete the enclosed self-administered evaluation form prior to your visit and bring it with you on the day of your appointment.  We will review it with you.  Also, please bring a complete list of all your medications or, if you prefer, bring the medication bottles and we will list them.  Please bring your insurance card so that we may make a copy of it.  If your insurance requires a referral to see a specialist, please bring your referral form from your primary care physician.  Co-payments are due at the time of your visit and may be paid by cash, check or credit card.     Your office visit will consist of a consult with your physician (includes a physical exam), any laboratory testing he/she may order, scheduling of any necessary diagnostic testing (e.g. x-ray, ultrasound, CT-scan), and scheduling of a procedure (e.g. Endoscopy, Colonoscopy) if required.  Please allow enough time on your schedule to allow for any/all of these possibilities.    If you cannot keep your appointment, please call [phone] to cancel or reschedule prior to your appointment date.  This allows [place] the opportunity to schedule an appointment for another patient in need of care.  If you do not cancel or reschedule by 5 p.m. the business day prior to your appointment date, you will be charged a $50.00 late cancellation/no-show fee.    Thank you for  choosing [facility] for your medical needs.  We appreciate the opportunity to care for you.  Please visit [place] at our website  to learn more about our practice.                     Sincerely,                                                             The Gastroenterology Division

## 2010-11-19 NOTE — Assessment & Plan Note (Signed)
Summary: HOSPT f/U   Vital Signs:  Patient profile:   35 year old female Height:      61 inches Weight:      234.0 pounds BMI:     44.37 Temp:     99.0 degrees F oral BP sitting:   130 / 88  (left arm) Cuff size:   large  Vitals Entered By: Almeta Monas CMA Duncan Dull) (November 11, 2010 11:00 AM) CC: hospt f/u---pt feels better, still having nausea   History of Present Illness: Pt here to f/u from ER for Norovirus.  She went to [facility] Tuesday and was given ivf and then threw up in the car on the way home so she went to [facility] ER and was given more IVF and phenergan and then went home at midnight.   This was 1 week ago and pt still has decreased appetite and nausea.  She has been living off of popsicles, bread and chicken broth.  No more vomiting --- just nauseous.  Pt was also told she had colitis in hospital and was on flagyl and cipro and finished them yesterday.  Pt is still having loose stools 1-2 x a day.  No abd pain.    Problems Prior to Update: 1)  Back Pain  (ICD-724.5) 2)  Breast Mass, Right  (ICD-611.72) 3)  Ankle Sprain, Right  (ICD-845.00) 4)  Sinusitis - Acute-nos  (ICD-461.9) 5)  Hypertension  (ICD-401.9) 6)  Hypokalemia  (ICD-276.8) 7)  Anxiety State, Unspecified  (ICD-300.00) 8)  Fibromyalgia  (ICD-729.1) 9)  Common Migraine  (ICD-346.10) 10)  Family History Depression  (ICD-V17.0) 11)  Family History Breast Cancer 1st Degree Relative <50  (ICD-V16.3) 12)  Gastroenteritis  (ICD-558.9) 13)  Dehydration  (ICD-276.51) 14)  Nausea and Vomiting  (ICD-787.01) 15)  Pruritus  (ICD-698.9) 16)  Cervical Lymphadenopathy  (ICD-785.6)  Medications Prior to Update: 1)  Benadryl 25 Mg Caps (Diphenhydramine Hcl) .... Twice 2)  Hydrochlorothiazide 25 Mg Tabs (Hydrochlorothiazide) .Marland Kitchen.. 1 Tab By Mouth Once Daily 3)  Alprazolam 0.25 Mg Tabs (Alprazolam) .... As Needed. 4)  Mirena 20 Mcg/24hr Iud (Levonorgestrel) 5)  Savella 50 Mg Tabs (Milnacipran Hcl) .Marland Kitchen.. 1 By Mouth Bid 6)  Toprol Xl  100 Mg Xr24h-Tab (Metoprolol Succinate) .Marland Kitchen.. 1 By Mouth Once Daily 7)  Klonopin 0.5 Mg Tabs (Clonazepam) .Marland Kitchen.. 1 By Mouth Three Times A Day 8)  Chlorpromazine Hcl 10 Mg Tabs (Chlorpromazine Hcl) .Marland Kitchen.. 1 By Mouth As Needed Max of 5 Per Day 9)  Flonase 50 Mcg/act Susp (Fluticasone Propionate) .... 2 Spray Each Nostril Once Daily 10)  Promethazine Hcl 25 Mg Tabs (Promethazine Hcl) .Marland Kitchen.. 1 By Mouth Qid As Needed  Current Medications (verified): 1)  Benadryl 25 Mg Caps (Diphenhydramine Hcl) .... Twice 2)  Mirena 20 Mcg/24hr Iud (Levonorgestrel) 3)  Toprol Xl 100 Mg Xr24h-Tab (Metoprolol Succinate) .Marland Kitchen.. 1 By Mouth Once Daily 4)  Klonopin 0.5 Mg Tabs (Clonazepam) .Marland Kitchen.. 1 By Mouth Three Times A Day 5)  Chlorpromazine Hcl 10 Mg Tabs (Chlorpromazine Hcl) .Marland Kitchen.. 1 By Mouth As Needed Max of 5 Per Day 6)  Flonase 50 Mcg/act Susp (Fluticasone Propionate) .... 2 Spray Each Nostril Once Daily 7)  Promethazine Hcl 25 Mg Tabs (Promethazine Hcl) .Marland Kitchen.. 1 By Mouth Qid As Needed 8)  Botox Inj--Once Every 3 Months 9)  Omeprazole 20 Mg Cpdr (Omeprazole) .Marland Kitchen.. 1 By Mouth Once Daily  Allergies (verified): 1)  ! Penicillin 2)  ! * Dilaudid 3)  ! Codeine  Past History:  Past Surgical  History: Last updated: 02/03/2010 1996 - femur and kneecap insert and removal of hardware 2003 - gallbladder removal 2007 - breast reduction 2010--Lasik   Family History: Last updated: 11/11/2010 mother, sister and brother alive and healthy father - arthritis, asbestosis, high blood pressure and heart murmur Family History of Arthritis Family History of Suicide attempt-- successful Family History of Skin cancer MGF--non hodkins lymphoma Family History Lung cancer Family History Breast cancer-- Paunts x2 Family History Hypertension Family History Psychiatric care Family History Depression Family History of Anxiety F-- asbestosis Crohns disease  Social History: Last updated: 02/24/2009 denies smoking drinks 1 drink per  week denies recreational drug use  Risk Factors: Alcohol Use: <1 (02/03/2010) Caffeine Use: 3 (02/03/2010) Exercise: yes (02/03/2010)  Risk Factors: Smoking Status: never (02/03/2010)  Past Medical History: high blood pressure anxiety migraines fibromyalgia Hypertension colitis  Family History: Reviewed history from 02/03/2010 and no changes required. mother, sister and brother alive and healthy father - arthritis, asbestosis, high blood pressure and heart murmur Family History of Arthritis Family History of Suicide attempt-- successful Family History of Skin cancer MGF--non hodkins lymphoma Family History Lung cancer Family History Breast cancer-- Paunts x2 Family History Hypertension Family History Psychiatric care Family History Depression Family History of Anxiety F-- asbestosis Crohns disease  Social History: Reviewed history from 02/24/2009 and no changes required. denies smoking drinks 1 drink per week denies recreational drug use  Review of Systems      See HPI  Physical Exam  General:  Well-developed,well-nourished,in no acute distress; alert,appropriate and cooperative throughout examination Neck:  No deformities, masses, or tenderness noted. Lungs:  Normal respiratory effort, chest expands symmetrically. Lungs are clear to auscultation, no crackles or wheezes. Heart:  normal rate and no murmur.   Abdomen:  Bowel sounds positive,abdomen soft and non-tender without masses, organomegaly or hernias noted. Msk:  R knee + crepitus no swelling  Extremities:  No clubbing, cyanosis, edema, or deformity noted with normal full range of motion of all joints.   Skin:  Intact without suspicious lesions or rashes Psych:  Cognition and judgment appear intact. Alert and cooperative with normal attention span and concentration. No apparent delusions, illusions, hallucinations   Impression & Recommendations:  Problem # 1:  COLITIS  (ICD-558.9)  Orders: Venipuncture (62703) TLB-CBC Platelet - w/Differential (85025-CBCD) TLB-Hepatic/Liver Function Pnl (80076-HEPATIC) TLB-BMP (Basic Metabolic Panel-BMET) (80048-METABOL) TLB-TSH (Thyroid Stimulating Hormone) (50093-GHW) Gastroenterology Referral (GI)  Discussed use of medication and role of diet. Encouraged clear liquids and electrolyte replacement fluids. Instructed to call if any signs of worsening dehydration.   Problem # 2:  KNEE PAIN, RIGHT (EXH-371.69)  Orders: Orthopedic Surgeon Referral (Ortho Surgeon) Knee Orthosis Elastic Knee Cap [phone])  Problem # 3:  NAUSEA (ICD-787.02) Assessment: Improved  Her updated medication list for this problem includes:    Benadryl 25 Mg Caps (Diphenhydramine hcl) .Marland Kitchen... Twice    Promethazine Hcl 25 Mg Tabs (Promethazine hcl) .Marland Kitchen... 1 by mouth qid as needed    omeprazole  Complete Medication List: 1)  Benadryl 25 Mg Caps (Diphenhydramine hcl) .... Twice 2)  Mirena 20 Mcg/24hr Iud (Levonorgestrel) 3)  Toprol Xl 100 Mg Xr24h-tab (Metoprolol succinate) .Marland Kitchen.. 1 by mouth once daily 4)  Klonopin 0.5 Mg Tabs (Clonazepam) .Marland Kitchen.. 1 by mouth three times a day 5)  Chlorpromazine Hcl 10 Mg Tabs (Chlorpromazine hcl) .Marland Kitchen.. 1 by mouth as needed max of 5 per day 6)  Flonase 50 Mcg/act Susp (Fluticasone propionate) .... 2 spray each nostril once daily 7)  Promethazine Hcl 25  Mg Tabs (Promethazine hcl) .Marland Kitchen.. 1 by mouth qid as needed 8)  Botox Inj--once Every 3 Months  9)  Omeprazole 20 Mg Cpdr (Omeprazole) .Marland Kitchen.. 1 by mouth once daily Prescriptions: OMEPRAZOLE 20 MG CPDR (OMEPRAZOLE) 1 by mouth once daily  #30 x 11   Entered and Authorized by:   Loreen Freud DO   Signed by:   Loreen Freud DO on 11/11/2010   Method used:   Electronically to        [facility] # [phone]* (retail)       [address] [place], [place]  [address]       Ph: [phone]       Fax: [phone]   RxID:   [id number]    Orders  Added: 1)  Venipuncture [44010] 2)  TLB-CBC Platelet - w/Differential [85025-CBCD] 3)  TLB-Hepatic/Liver Function Pnl [80076-HEPATIC] 4)  TLB-BMP (Basic Metabolic Panel-BMET) [80048-METABOL] 5)  TLB-TSH (Thyroid Stimulating Hormone) [84443-TSH] 6)  Orthopedic Surgeon Referral [Ortho Surgeon] 7)  Est. Patient Level IV [27253] 8)  Knee Orthosis Elastic Knee Cap [L1825] 9)  Gastroenterology Referral [GI]

## 2010-12-16 ENCOUNTER — Ambulatory Visit: Payer: BC Managed Care – PPO | Admitting: Gastroenterology

## 2010-12-31 ENCOUNTER — Telehealth: Payer: Self-pay | Admitting: Family Medicine

## 2010-12-31 MED ORDER — METOPROLOL SUCCINATE ER 100 MG PO TB24: 100.0000 mg | ORAL_TABLET | Freq: Every day | ORAL | Status: DC

## 2010-12-31 MED ORDER — FLUTICASONE PROPIONATE 50 MCG/ACT NA SUSP: 1.0000 | Freq: Every day | NASAL | Status: DC

## 2010-12-31 NOTE — Telephone Encounter (Signed)
Patient needs 1 month supply caled in for toporal- flonase [facility] [address] [facility] [place]

## 2011-01-29 NOTE — Op Note (Signed)
NAMEMARJORIA, Bethany Montgomery              ACCOUNT NO.:  [id number]   MEDICAL RECORD NO.:  [id number]          PATIENT TYPE:  AMB   LOCATION:  DSC                          FACILITY:  [facility]   PHYSICIAN:  Earvin Hansen L. Shon Hough, M.D.DATE OF BIRTH:  1976-01-15   DATE OF PROCEDURE:  06/06/2006  DATE OF DISCHARGE:                                 OPERATIVE REPORT   PREOPERATIVE DIAGNOSIS:   POSTOPERATIVE DIAGNOSIS:   OPERATION PERFORMED:  Bilateral breast reductions using the inferior pedicle  technique.   SURGEON:  Yaakov Guthrie. Shon Hough, M.D.   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  She has intertriginous changes causing irritation, pitting, at  both shoulders.   Preoperatively the patient was stood up and she was drawn for the inferior  pedicle reduction mammoplasty remarked and the nipple areolar complexes back  to 22 cm from the suprasternal notch.   DESCRIPTION OF THE OPERATION:  The patient was then underwent general  anesthesia and was intubated orally.  Prep was done to the chest and breast  areas in the routine fashion using Betadine soap and solution,  and water  with sterile towels and drapes so as to make a sterile field.  One-quarter  percent Xylocaine with epinephrine was injected locally for a total of 150  mL per side.  After this the wounds were just scored with a number 15 blade  and then the skin of the inferior pedicle was deepithelialized with a number  20 blade.  Medially and laterally fatty dermal pedicles were sliced down to  the underlying fascia.  Next the new keyhole ara was debulked down laterally  and more accessory breast tissue was removed.  After we were happy with the  amount we then were able to transpose the flaps and secure them with 3-0  Prolene.  Subcutaneous closure was done with 3-0 Monocryl times two layers  in a running subcuticular  stitches of 3-0 Monocryl and 5-0 Monocryl  throughout the inverted T.   The wounds were drained with number 10 fully fluted Blake drain, which was  placed in the depths of the wound and brought out through the lateral most  portion of the incision, and secured with 3-0 Prolene.   The same procedure was carried out on both sides.   The left side had a removal of 857 grams and the right 813 grams.   The wounds were cleansed.  Steri-strips with soft dressing were applied in  all the areas.   The patient withstood the procedure very well and was taken to recovery in  excellent condition.   ESTIMATED BLOOD LOSS:  The estimated blood loss was less than a hundred  milliliters.   COMPLICATIONS:  None.      Yaakov Guthrie. Shon Hough, M.D.  Electronically Signed     GLT/MEDQ  D:  06/06/2006  T:  06/08/2006  Job:  [id number]

## 2011-02-10 ENCOUNTER — Other Ambulatory Visit: Payer: Self-pay | Admitting: Family Medicine

## 2011-03-09 ENCOUNTER — Other Ambulatory Visit: Payer: Self-pay | Admitting: Family Medicine

## 2011-03-09 MED ORDER — CLONAZEPAM 0.5 MG PO TABS: 0.5000 mg | ORAL_TABLET | Freq: Three times a day (TID) | ORAL | Status: DC | PRN

## 2011-03-09 NOTE — Telephone Encounter (Signed)
Faxed.   KP 

## 2011-03-09 NOTE — Telephone Encounter (Signed)
Last seen 11/11/10 and filled 10/01/10 #90 with 3 refills  Please advise    KP

## 2011-04-07 ENCOUNTER — Other Ambulatory Visit: Payer: Self-pay | Admitting: Family Medicine

## 2011-04-07 NOTE — Telephone Encounter (Signed)
Last seen 11/11/10 and filled 03/09/11.... Please advise     KP

## 2011-04-08 MED ORDER — CLONAZEPAM 0.5 MG PO TABS: 0.5000 mg | ORAL_TABLET | Freq: Three times a day (TID) | ORAL | Status: DC | PRN

## 2011-04-08 NOTE — Telephone Encounter (Signed)
Faxed.   KP 

## 2011-05-11 ENCOUNTER — Other Ambulatory Visit: Payer: Self-pay | Admitting: Family Medicine

## 2011-05-11 MED ORDER — CLONAZEPAM 0.5 MG PO TABS: 0.5000 mg | ORAL_TABLET | Freq: Three times a day (TID) | ORAL | Status: DC | PRN

## 2011-05-11 NOTE — Telephone Encounter (Signed)
Last seen 11/11/10 and filled 04/07/11 please advise    KP

## 2011-05-11 NOTE — Telephone Encounter (Signed)
Faxed.   KP 

## 2011-06-14 ENCOUNTER — Other Ambulatory Visit: Payer: Self-pay | Admitting: Family Medicine

## 2011-06-14 MED ORDER — CLONAZEPAM 0.5 MG PO TABS: 0.5000 mg | ORAL_TABLET | Freq: Three times a day (TID) | ORAL | Status: DC | PRN

## 2011-06-14 NOTE — Telephone Encounter (Signed)
Last seen 11/11/10 and filled 05/11/11 please advise   KP

## 2011-06-21 ENCOUNTER — Ambulatory Visit (INDEPENDENT_AMBULATORY_CARE_PROVIDER_SITE_OTHER): Payer: BC Managed Care – PPO | Admitting: Family Medicine

## 2011-06-21 ENCOUNTER — Encounter: Payer: Self-pay | Admitting: Family Medicine

## 2011-06-21 DIAGNOSIS — R109 Unspecified abdominal pain: Secondary | ICD-10-CM | POA: Insufficient documentation

## 2011-06-21 DIAGNOSIS — I10 Essential (primary) hypertension: Secondary | ICD-10-CM

## 2011-06-21 DIAGNOSIS — R110 Nausea: Secondary | ICD-10-CM

## 2011-06-21 LAB — AMYLASE: Amylase: 39 U/L (ref 27–131)

## 2011-06-21 LAB — CBC WITH DIFFERENTIAL/PLATELET
Basophils Absolute: 0.1 10*3/uL (ref 0.0–0.1)
Eosinophils Absolute: 0.2 10*3/uL (ref 0.0–0.7)
HCT: 37.9 % (ref 36.0–46.0)
Lymphocytes Relative: 18.4 % (ref 12.0–46.0)
MCHC: 33.5 g/dL (ref 30.0–36.0)
MCV: 87.4 fl (ref 78.0–100.0)
Neutrophils Relative %: 73.6 % (ref 43.0–77.0)

## 2011-06-21 LAB — POCT URINALYSIS DIPSTICK
Bilirubin, UA: NEGATIVE
Glucose, UA: NEGATIVE
Ketones, UA: NEGATIVE
Protein, UA: NEGATIVE
pH, UA: 8.5

## 2011-06-21 LAB — HEPATIC FUNCTION PANEL
AST: 17 U/L (ref 0–37)
Albumin: 4 g/dL (ref 3.5–5.2)
Alkaline Phosphatase: 97 U/L (ref 39–117)
Bilirubin, Direct: 0 mg/dL (ref 0.0–0.3)
Total Protein: 7.2 g/dL (ref 6.0–8.3)

## 2011-06-21 LAB — BASIC METABOLIC PANEL
CO2: 30 mEq/L (ref 19–32)
Calcium: 9.3 mg/dL (ref 8.4–10.5)
Chloride: 101 mEq/L (ref 96–112)
GFR: 122.54 mL/min (ref >60.00)
Sodium: 139 mEq/L (ref 135–145)

## 2011-06-21 MED ORDER — PROMETHAZINE HCL 25 MG PO TABS: 25.0000 mg | ORAL_TABLET | Freq: Four times a day (QID) | ORAL | Status: DC | PRN

## 2011-06-21 NOTE — Patient Instructions (Signed)
Follow up in 2 weeks w/ Dr Laury Axon to recheck blood pressure We'll notify you of your lab results PLEASE START YOUR BLOOD PRESSURE MEDS Drink plenty of fluids Use the phenergan as needed for nausea Call if your symptoms change or worsen Hang in there!!!

## 2011-06-21 NOTE — Progress Notes (Signed)
  Subjective:    Patient ID: Bethany Montgomery, female    DOB: 12/20/1975, 35 y.o.   MRN: [id number]  HPI R Flank pain- sxs started 2 days ago.  Denies dysuria.  + frequency.  No hematuria, hesitancy.  Increased thirst.  + nausea, no vomiting.  Did a 5K earlier in the day when sxs started.  Pain is worse w/ movement.  HTN- chronic problem, not taking meds.  + HA.  No CP, SOB, visual changes.  Does not have meds at home- 'i never got it refilled'.   Review of Systems For ROS see HPI     Objective:   Physical Exam  Vitals reviewed. Constitutional: She is oriented to person, place, and time. She appears well-developed and well-nourished. No distress.  HENT:  Head: Normocephalic and atraumatic.  Eyes: Conjunctivae and EOM are normal. Pupils are equal, round, and reactive to light.  Neck: Normal range of motion. Neck supple. No thyromegaly present.  Cardiovascular: Normal rate, regular rhythm, normal heart sounds and intact distal pulses.   No murmur heard. Pulmonary/Chest: Effort normal and breath sounds normal. No respiratory distress.  Abdominal: Soft. Bowel sounds are normal. She exhibits no distension and no mass. There is tenderness (no CVA or suprapubic tenderness, + TTP over insertion of abdominal muscles on R side). There is no rebound and no guarding.  Musculoskeletal: She exhibits no edema.  Lymphadenopathy:    She has no cervical adenopathy.  Neurological: She is alert and oriented to person, place, and time.  Skin: Skin is warm and dry.  Psychiatric: She has a normal mood and affect. Her behavior is normal.          Assessment & Plan:

## 2011-06-22 NOTE — Progress Notes (Signed)
Quick Note:    Labs mailed.  ______

## 2011-06-29 NOTE — Assessment & Plan Note (Signed)
Likely muscular given participation in 5k after period of inactivity and fact pain worsens w/ movement.  NSAIDs prn.  No evidence of UTI on UA.  Reviewed supportive care and red flags that should prompt return.  Pt expressed understanding and is in agreement w/ plan.

## 2011-06-29 NOTE — Assessment & Plan Note (Signed)
Chronic problem, deteriorated.  Asymptomatic.  Stressed importance of taking meds regularly to prevent MI, CVA.  Pt aware.  To f/u w/ PCP.

## 2011-06-29 NOTE — Assessment & Plan Note (Signed)
Etiology unclear.  Check labs to r/o metabolic cause.  Phenergan prn.  Encouraged fluids.

## 2011-07-07 ENCOUNTER — Ambulatory Visit: Payer: BC Managed Care – PPO | Admitting: Family Medicine

## 2011-07-13 ENCOUNTER — Other Ambulatory Visit: Payer: Self-pay

## 2011-07-13 MED ORDER — CLONAZEPAM 0.5 MG PO TABS: 0.5000 mg | ORAL_TABLET | Freq: Three times a day (TID) | ORAL | Status: DC | PRN

## 2011-07-13 NOTE — Telephone Encounter (Signed)
Last seen 11/11/10 and filled 06/14/11 please advise   KP

## 2011-07-21 ENCOUNTER — Other Ambulatory Visit: Payer: Self-pay | Admitting: Family Medicine

## 2011-07-21 ENCOUNTER — Ambulatory Visit (INDEPENDENT_AMBULATORY_CARE_PROVIDER_SITE_OTHER): Payer: BC Managed Care – PPO | Admitting: Family Medicine

## 2011-07-21 ENCOUNTER — Encounter: Payer: Self-pay | Admitting: Family Medicine

## 2011-07-21 VITALS — BP 120/80 | HR 75 | Temp 97.4°F | Wt 218.8 lb

## 2011-07-21 DIAGNOSIS — I10 Essential (primary) hypertension: Secondary | ICD-10-CM

## 2011-07-21 DIAGNOSIS — F411 Generalized anxiety disorder: Secondary | ICD-10-CM

## 2011-07-21 DIAGNOSIS — F419 Anxiety disorder, unspecified: Secondary | ICD-10-CM

## 2011-07-21 MED ORDER — CLONAZEPAM 1 MG PO TABS: 1.0000 mg | ORAL_TABLET | Freq: Three times a day (TID) | ORAL | Status: DC

## 2011-07-21 MED ORDER — METOPROLOL SUCCINATE ER 100 MG PO TB24: ORAL_TABLET | ORAL | Status: DC

## 2011-07-21 NOTE — Progress Notes (Signed)
  Subjective:    Patient here for follow-up of elevated blood pressure.  She is not exercising and is adherent to a low-salt diet.  Blood pressure is well controlled at home. Cardiac symptoms: none. Patient denies: chest pain, chest pressure/discomfort, claudication, dyspnea, exertional chest pressure/discomfort, fatigue, irregular heart beat, lower extremity edema, near-syncope, orthopnea, palpitations, paroxysmal nocturnal dyspnea, syncope and tachypnea. Cardiovascular risk factors: none. Use of agents associated with hypertension: none. History of target organ damage: none.  Pt would also like to increase Klonopin to 78m ---that is what she was taking prior to stopping the first time.    The following portions of the patient's history were reviewed and updated as appropriate: allergies, current medications, past family history, past medical history, past social history, past surgical history and problem list.  Review of Systems Pertinent items are noted in HPI.     Objective:    BP 120/80  Pulse 75  Temp(Src) 97.4 F (36.3 C) (Oral)  Wt 218 lb 12.8 oz (99.247 kg)  SpO2 98% General appearance: alert, cooperative, appears stated age and no distress Lungs: clear to auscultation bilaterally Heart: regular rate and rhythm, S1, S2 normal, no murmur, click, rub or gallop Extremities: extremities normal, atraumatic, no cyanosis or edema Neurologic: Alert and oriented X 3, normal strength and tone. Normal symmetric reflexes. Normal coordination and gait    Assessment:    Hypertension, normal blood pressure . Evidence of target organ damage: none.   anxiety--- increase klonopin 1 mg tid prn  Plan:    Medication: no change. Dietary sodium restriction. Regular aerobic exercise. Follow up: 6 months and as needed.

## 2011-07-21 NOTE — Patient Instructions (Signed)

## 2011-07-21 NOTE — Telephone Encounter (Signed)
Rx faxed and patient has been made aware      KP 

## 2011-09-06 ENCOUNTER — Other Ambulatory Visit: Payer: Self-pay | Admitting: Family Medicine

## 2011-09-06 DIAGNOSIS — F419 Anxiety disorder, unspecified: Secondary | ICD-10-CM

## 2011-09-06 MED ORDER — CLONAZEPAM 1 MG PO TABS: 1.0000 mg | ORAL_TABLET | Freq: Three times a day (TID) | ORAL | Status: DC

## 2011-09-06 NOTE — Telephone Encounter (Signed)
Last seen and filled 07/21/11 # 90. Please advise    KP

## 2011-09-27 ENCOUNTER — Ambulatory Visit (INDEPENDENT_AMBULATORY_CARE_PROVIDER_SITE_OTHER): Payer: BC Managed Care – PPO | Admitting: Family Medicine

## 2011-09-27 ENCOUNTER — Encounter: Payer: Self-pay | Admitting: Family Medicine

## 2011-09-27 VITALS — BP 120/80 | HR 98 | Temp 98.8°F | Wt 230.8 lb

## 2011-09-27 DIAGNOSIS — R059 Cough, unspecified: Secondary | ICD-10-CM

## 2011-09-27 DIAGNOSIS — R05 Cough: Secondary | ICD-10-CM

## 2011-09-27 DIAGNOSIS — J329 Chronic sinusitis, unspecified: Secondary | ICD-10-CM

## 2011-09-27 MED ORDER — GUAIFENESIN-CODEINE 100-10 MG/5ML PO SYRP: ORAL_SOLUTION | ORAL | Status: DC

## 2011-09-27 MED ORDER — FLUTICASONE PROPIONATE 50 MCG/ACT NA SUSP: 2.0000 | Freq: Every day | NASAL | Status: DC

## 2011-09-27 MED ORDER — CLARITHROMYCIN ER 500 MG PO TB24: 1000.0000 mg | ORAL_TABLET | Freq: Every day | ORAL | Status: AC

## 2011-09-27 NOTE — Patient Instructions (Signed)

## 2011-09-27 NOTE — Progress Notes (Signed)
  Subjective:     Bethany Montgomery is a 36 y.o. female who presents for evaluation of sinus pain. Symptoms include: congestion, cough, facial pain, fevers, headaches, nasal congestion, sinus pressure, sneezing and sore throat. Onset of symptoms was 2 weeks ago. Symptoms have been gradually worsening since that time. Past history is significant for no history of pneumonia or bronchitis. Patient is a non-smoker.  The following portions of the patient's history were reviewed and updated as appropriate: allergies, current medications, past family history, past medical history, past social history, past surgical history and problem list.  Review of Systems Pertinent items are noted in HPI.   Objective:    BP 120/80  Pulse 98  Temp(Src) 98.8 F (37.1 C) (Oral)  Wt 230 lb 12.8 oz (104.69 kg)  SpO2 97% General appearance: alert, cooperative, appears stated age and no distress Head: Normocephalic, without obvious abnormality, atraumatic Ears: normal TM's and external ear canals both ears Nose: green discharge, moderate congestion, sinus tenderness bilateral Throat: lips, mucosa, and tongue normal; teeth and gums normal Neck: marked anterior cervical adenopathy, supple, symmetrical, trachea midline and thyroid not enlarged, symmetric, no tenderness/mass/nodules Lungs: clear to auscultation bilaterally Heart: regular rate and rhythm, S1, S2 normal, no murmur, click, rub or gallop Extremities: extremities normal, atraumatic, no cyanosis or edema    Assessment:    Acute bacterial sinusitis.    Plan:    Nasal steroids per medication orders. Antihistamines per medication orders. Biaxin per medication orders. Follow up in a few days or as needed.

## 2011-09-28 ENCOUNTER — Telehealth: Payer: Self-pay | Admitting: *Deleted

## 2011-09-28 MED ORDER — BENZONATATE 200 MG PO CAPS: 200.0000 mg | ORAL_CAPSULE | Freq: Three times a day (TID) | ORAL | Status: AC | PRN

## 2011-09-28 NOTE — Telephone Encounter (Signed)
If she can't have codeine than there is really nothing better than otc.  We can call in tessalon perles if she would like  200 mg

## 2011-09-28 NOTE — Telephone Encounter (Signed)
Spoke to pt and she was satisfied with the tessalon perles, sent to [facility] per pt verified pharmacy noted in chart

## 2011-09-28 NOTE — Telephone Encounter (Signed)
Pt came in office yesterday 09-27-11 received rx for cough syrup (guaiFENesin-codeine (ROBITUSSIN AC) 100-10  Pt left vm to advise she is allergic to this medication can anything else be called in for her?

## 2011-10-07 ENCOUNTER — Ambulatory Visit: Payer: BC Managed Care – PPO | Admitting: Family Medicine

## 2011-10-07 ENCOUNTER — Ambulatory Visit (INDEPENDENT_AMBULATORY_CARE_PROVIDER_SITE_OTHER): Payer: BC Managed Care – PPO | Admitting: Family Medicine

## 2011-10-07 ENCOUNTER — Ambulatory Visit (HOSPITAL_BASED_OUTPATIENT_CLINIC_OR_DEPARTMENT_OTHER)
Admission: RE | Admit: 2011-10-07 | Discharge: 2011-10-07 | Disposition: A | Discharge status: Home / Self Care | Payer: BC Managed Care – PPO | Source: Ambulatory Visit | Attending: Family Medicine | Admitting: Family Medicine

## 2011-10-07 ENCOUNTER — Encounter: Payer: Self-pay | Admitting: Family Medicine

## 2011-10-07 VITALS — BP 120/74 | HR 80 | Temp 98.3°F | Wt 230.0 lb

## 2011-10-07 DIAGNOSIS — J40 Bronchitis, not specified as acute or chronic: Secondary | ICD-10-CM

## 2011-10-07 DIAGNOSIS — R062 Wheezing: Secondary | ICD-10-CM

## 2011-10-07 DIAGNOSIS — R0989 Other specified symptoms and signs involving the circulatory and respiratory systems: Secondary | ICD-10-CM | POA: Insufficient documentation

## 2011-10-07 DIAGNOSIS — R059 Cough, unspecified: Secondary | ICD-10-CM | POA: Insufficient documentation

## 2011-10-07 DIAGNOSIS — R05 Cough: Secondary | ICD-10-CM | POA: Insufficient documentation

## 2011-10-07 MED ORDER — PREDNISONE 10 MG PO TABS: ORAL_TABLET | ORAL | Status: DC

## 2011-10-07 MED ORDER — ALBUTEROL SULFATE HFA 108 (90 BASE) MCG/ACT IN AERS: 2.0000 | INHALATION_SPRAY | Freq: Four times a day (QID) | RESPIRATORY_TRACT | Status: DC | PRN

## 2011-10-07 MED ORDER — MOXIFLOXACIN HCL 400 MG PO TABS: 400.0000 mg | ORAL_TABLET | Freq: Every day | ORAL | Status: AC

## 2011-10-07 MED ORDER — ALBUTEROL SULFATE (5 MG/ML) 0.5% IN NEBU
2.5000 mg | INHALATION_SOLUTION | Freq: Once | RESPIRATORY_TRACT | Status: AC
  Administered 2011-10-07: 2.5 mg via RESPIRATORY_TRACT

## 2011-10-07 NOTE — Progress Notes (Signed)
  Subjective:     Bethany Montgomery is a 36 y.o. female here for evaluation of a cough. Onset of symptoms was several weeks ago. Symptoms have been gradually worsening since that time. The cough is productive and is aggravated by nothing. Associated symptoms include: chills, fever, shortness of breath, sputum production and wheezing. Patient does have a history of asthma. Patient does not have a history of environmental allergens. Patient has not traveled recently. Patient does not have a history of smoking. Patient has not had a previous chest x-ray. Patient has not had a PPD done.  The following portions of the patient's history were reviewed and updated as appropriate: allergies, current medications, past family history, past medical history, past social history, past surgical history and problem list.  Review of Systems Pertinent items are noted in HPI.    Objective:    Oxygen saturation 97% on room air BP 120/74  Pulse 80  Temp(Src) 98.3 F (36.8 C) (Oral)  Wt 230 lb (104.327 kg)  SpO2 97% General appearance: alert, cooperative, appears stated age and no distress Ears: normal TM's and external ear canals both ears Nose: Nares normal. Septum midline. Mucosa normal. No drainage or sinus tenderness. Throat: lips, mucosa, and tongue normal; teeth and gums normal Neck: no adenopathy, no carotid bruit, no JVD, supple, symmetrical, trachea midline and thyroid not enlarged, symmetric, no tenderness/mass/nodules Lungs: wheezes bilaterally Heart: S1, S2 normal    Assessment:    Acute Bronchitis    Plan:    Antibiotics per medication orders. Antitussives per medication orders. Avoid exposure to tobacco smoke and fumes. B-agonist inhaler. Call if shortness of breath worsens, blood in sputum, change in character of cough, development of fever or chills, inability to maintain nutrition and hydration. Avoid exposure to tobacco smoke and fumes. Chest x-ray.

## 2011-10-07 NOTE — Patient Instructions (Signed)

## 2011-10-11 ENCOUNTER — Other Ambulatory Visit: Payer: Self-pay | Admitting: Family Medicine

## 2011-10-11 DIAGNOSIS — F419 Anxiety disorder, unspecified: Secondary | ICD-10-CM

## 2011-10-11 MED ORDER — CLONAZEPAM 1 MG PO TABS: 1.0000 mg | ORAL_TABLET | Freq: Three times a day (TID) | ORAL | Status: DC

## 2011-10-11 NOTE — Telephone Encounter (Signed)
Last seen 10/07/11 and filled 09/06/11 # 90. Please advise   KP

## 2011-10-11 NOTE — Telephone Encounter (Signed)
Rx sent 

## 2011-10-11 NOTE — Telephone Encounter (Signed)
Refill x1 

## 2011-11-17 ENCOUNTER — Telehealth: Payer: Self-pay | Admitting: Family Medicine

## 2011-11-17 DIAGNOSIS — F419 Anxiety disorder, unspecified: Secondary | ICD-10-CM

## 2011-11-17 NOTE — Telephone Encounter (Signed)
Refill x1 

## 2011-11-17 NOTE — Telephone Encounter (Signed)
ClonazePAM (Tab) KLONOPIN 1 MG Take 1 tablet (1 mg total) by mouth 3 (three) times daily.

## 2011-11-17 NOTE — Telephone Encounter (Signed)
Last seen 10/07/11 and filled 10/11/11 # 90. Please advise     KP

## 2011-11-18 MED ORDER — CLONAZEPAM 1 MG PO TABS
1.0000 mg | ORAL_TABLET | Freq: Three times a day (TID) | ORAL | Status: DC
Start: 2011-11-18 — End: 2011-12-21

## 2011-11-26 ENCOUNTER — Telehealth: Payer: Self-pay | Admitting: Family Medicine

## 2011-11-26 NOTE — Telephone Encounter (Signed)
Received copies from [facility] ,on 11/26/11. Forwarded 3  pages to Dr Laury Axon. ,for review.

## 2011-12-15 ENCOUNTER — Encounter: Payer: Self-pay | Admitting: Family Medicine

## 2011-12-15 ENCOUNTER — Ambulatory Visit (INDEPENDENT_AMBULATORY_CARE_PROVIDER_SITE_OTHER): Payer: BC Managed Care – PPO | Admitting: Family Medicine

## 2011-12-15 VITALS — BP 134/90 | HR 107 | Temp 98.3°F | Wt 231.6 lb

## 2011-12-15 DIAGNOSIS — J40 Bronchitis, not specified as acute or chronic: Secondary | ICD-10-CM

## 2011-12-15 DIAGNOSIS — I10 Essential (primary) hypertension: Secondary | ICD-10-CM

## 2011-12-15 DIAGNOSIS — J189 Pneumonia, unspecified organism: Secondary | ICD-10-CM

## 2011-12-15 MED ORDER — HYDROCODONE-HOMATROPINE 5-1.5 MG PO TABS: ORAL_TABLET | ORAL | Status: DC

## 2011-12-15 MED ORDER — METOPROLOL SUCCINATE ER 100 MG PO TB24: ORAL_TABLET | ORAL | Status: DC

## 2011-12-15 MED ORDER — MOXIFLOXACIN HCL 400 MG PO TABS: 400.0000 mg | ORAL_TABLET | Freq: Every day | ORAL | Status: AC

## 2011-12-15 MED ORDER — PREDNISONE 10 MG PO TABS: ORAL_TABLET | ORAL | Status: DC

## 2011-12-15 NOTE — Patient Instructions (Signed)

## 2011-12-15 NOTE — Progress Notes (Signed)
  Subjective:     Bethany Montgomery is a 36 y.o. female here for evaluation of a cough. Onset of symptoms was 7 days ago. Symptoms have been gradually worsening since that time. The cough is barky and productive and is aggravated by exercise and reclining position. Associated symptoms include: chills, fever and shortness of breath. Patient does not have a history of asthma. Patient does have a history of environmental allergens. Patient has not traveled recently. Patient does not have a history of smoking. Patient has had a previous chest x-ray. Patient has not had a PPD done.  Pt has had fevers 102.3  And was dx with pneumonia ---and was put on z pak.    The following portions of the patient's history were reviewed and updated as appropriate: allergies, current medications, past family history, past medical history, past social history, past surgical history and problem list.  Review of Systems Pertinent items are noted in HPI.    Objective:     Oxygen saturation 97% on room air BP 134/90  Pulse 107  Temp(Src) 98.3 F (36.8 C) (Oral)  Wt 231 lb 9.6 oz (105.053 kg)  SpO2 97% General appearance: alert, cooperative, appears stated age and no distress Ears: normal TM's and external ear canals both ears and normal TM&#39;s and external ear canals both ears Nose: Nares normal. Septum midline. Mucosa normal. No drainage or sinus tenderness. Throat: lips, mucosa, and tongue normal; teeth and gums normal Neck: no adenopathy, supple, symmetrical, trachea midline and thyroid not enlarged, symmetric, no tenderness/mass/nodules Lungs: diminished breath sounds RUL Heart: regular rate and rhythm, S1, S2 normal, no murmur, click, rub or gallop    Assessment:    Pneumonia    Plan:    Antibiotics per medication orders. Antitussives per medication orders. Avoid exposure to tobacco smoke and fumes. Call if shortness of breath worsens, blood in sputum, change in character of cough, development of fever or  chills, inability to maintain nutrition and hydration. Avoid exposure to tobacco smoke and fumes. Chest x-ray.

## 2011-12-21 ENCOUNTER — Telehealth: Payer: Self-pay | Admitting: Family Medicine

## 2011-12-21 DIAGNOSIS — F419 Anxiety disorder, unspecified: Secondary | ICD-10-CM

## 2011-12-21 MED ORDER — CLONAZEPAM 1 MG PO TABS: 1.0000 mg | ORAL_TABLET | Freq: Three times a day (TID) | ORAL | Status: DC

## 2011-12-21 NOTE — Telephone Encounter (Signed)
Refill x1 

## 2011-12-21 NOTE — Telephone Encounter (Signed)
Clonazepam 1mg  tablet. Take one tablet 3 times a day.

## 2011-12-21 NOTE — Telephone Encounter (Signed)
Last seen 12/15/11 and filled 11/18/11 # 90. Please advise     KP

## 2011-12-21 NOTE — Telephone Encounter (Signed)
Faxed.   KP 

## 2011-12-22 ENCOUNTER — Telehealth: Payer: Self-pay | Admitting: Family Medicine

## 2011-12-22 ENCOUNTER — Ambulatory Visit (HOSPITAL_BASED_OUTPATIENT_CLINIC_OR_DEPARTMENT_OTHER)
Admission: RE | Admit: 2011-12-22 | Discharge: 2011-12-22 | Disposition: A | Discharge status: Home / Self Care | Payer: BC Managed Care – PPO | Source: Ambulatory Visit | Attending: Family Medicine | Admitting: Family Medicine

## 2011-12-22 ENCOUNTER — Encounter: Payer: Self-pay | Admitting: Family Medicine

## 2011-12-22 ENCOUNTER — Ambulatory Visit (INDEPENDENT_AMBULATORY_CARE_PROVIDER_SITE_OTHER): Payer: BC Managed Care – PPO | Admitting: Family Medicine

## 2011-12-22 VITALS — BP 128/78 | HR 92 | Temp 98.2°F | Ht 65.0 in | Wt 239.0 lb

## 2011-12-22 DIAGNOSIS — J189 Pneumonia, unspecified organism: Secondary | ICD-10-CM | POA: Insufficient documentation

## 2011-12-22 DIAGNOSIS — J168 Pneumonia due to other specified infectious organisms: Secondary | ICD-10-CM

## 2011-12-22 MED ORDER — IPRATROPIUM BROMIDE 0.02 % IN SOLN
0.5000 mg | Freq: Once | RESPIRATORY_TRACT | Status: AC
  Administered 2011-12-22: 0.5 mg via RESPIRATORY_TRACT

## 2011-12-22 MED ORDER — ALBUTEROL SULFATE (5 MG/ML) 0.5% IN NEBU
2.5000 mg | INHALATION_SOLUTION | Freq: Once | RESPIRATORY_TRACT | Status: AC
  Administered 2011-12-22: 2.5 mg via RESPIRATORY_TRACT

## 2011-12-22 MED ORDER — COMPRESSOR/NEBULIZER MISC: Status: AC

## 2011-12-22 MED ORDER — ALBUTEROL SULFATE (2.5 MG/3ML) 0.083% IN NEBU: 2.5000 mg | INHALATION_SOLUTION | RESPIRATORY_TRACT | Status: AC | PRN

## 2011-12-22 NOTE — Patient Instructions (Signed)
We'll call you with your xray results Use the nebulizer every 4-6 hrs as needed Finish the Avelox and prednisone Call with any questions or concerns Hang in there!

## 2011-12-22 NOTE — Progress Notes (Signed)
  Subjective:    Patient ID: Bethany Montgomery, female    DOB: 02/14/1976, 36 y.o.   MRN: [id number]  HPI PNA- still on Avelox and prednisone.  Using cough meds.  Just had repeat CXR.  Pt reports chest pain is better but remains 'extremely fatigued'.  Cough remains.  'feels like i'm breathing under water'.  Difficulty lying down.  No fevers.   Review of Systems For ROS see HPI     Objective:   Physical Exam  Constitutional: She appears well-developed and well-nourished. No distress.  HENT:  Head: Normocephalic and atraumatic.       TMs normal bilaterally Mild nasal congestion Throat w/out erythema, edema, or exudate  Eyes: Conjunctivae and EOM are normal. Pupils are equal, round, and reactive to light.  Neck: Normal range of motion. Neck supple.  Cardiovascular: Normal rate, regular rhythm, normal heart sounds and intact distal pulses.   No murmur heard. Pulmonary/Chest: Effort normal. No respiratory distress. She has no wheezes. She has no rales.       No cough heard Decreased air movement at R base  Lymphadenopathy:    She has no cervical adenopathy.          Assessment & Plan:

## 2011-12-22 NOTE — Assessment & Plan Note (Signed)
New.  Pt currently on abx and prednisone for bronchospasm.  Pt still having chest tightness despite albuterol HFA.  Air movement improved s/p neb tx in office.  Script given for nebulizer and albuterol.  Reviewed supportive care and red flags that should prompt return.  Pt expressed understanding and is in agreement w/ plan.

## 2011-12-22 NOTE — Telephone Encounter (Signed)
Caller: Kessie/Patient; PCP: Lelon Perla.; CB#: [phone];  Call regarding Follow Up Due To Pneumonia 12/12/11; Cough ands she feels short of breath and that cough is worsening.  States she knows she is due to get follow up CXR after being checked in ED.  Blood streaked sputum.  Advised see in 4 hours per Cough protcocl. Caller states she will have follow up CXR done before being seen.  Appt. W/ Dr. Beverely Low at 1300.

## 2012-01-17 ENCOUNTER — Other Ambulatory Visit: Payer: Self-pay | Admitting: Family Medicine

## 2012-01-18 ENCOUNTER — Other Ambulatory Visit: Payer: Self-pay | Admitting: Family Medicine

## 2012-01-18 DIAGNOSIS — F419 Anxiety disorder, unspecified: Secondary | ICD-10-CM

## 2012-01-18 MED ORDER — CLONAZEPAM 1 MG PO TABS: 1.0000 mg | ORAL_TABLET | Freq: Three times a day (TID) | ORAL | Status: DC

## 2012-01-18 NOTE — Telephone Encounter (Signed)
Faxed to [facility] [address]    KP 

## 2012-01-18 NOTE — Telephone Encounter (Signed)
refill for clonazepam 1mg  tablet  Take one tablet by mouth 3-timed a day   Last written 4.9.13 Qty 90 Last ov 4.10.13

## 2012-01-18 NOTE — Telephone Encounter (Signed)
Last seen 12/15/11 and filled 12/21/11 # 90. Please advise    KP

## 2012-01-18 NOTE — Telephone Encounter (Signed)
Ok to refill x 1  

## 2012-01-19 ENCOUNTER — Encounter: Payer: Self-pay | Admitting: Family Medicine

## 2012-01-19 ENCOUNTER — Ambulatory Visit (INDEPENDENT_AMBULATORY_CARE_PROVIDER_SITE_OTHER): Payer: BC Managed Care – PPO | Admitting: Family Medicine

## 2012-01-19 VITALS — BP 138/78 | HR 131 | Temp 99.6°F | Wt 230.8 lb

## 2012-01-19 DIAGNOSIS — I10 Essential (primary) hypertension: Secondary | ICD-10-CM

## 2012-01-19 NOTE — Patient Instructions (Addendum)

## 2012-01-19 NOTE — Progress Notes (Signed)
  Subjective:    Patient here for follow-up of elevated blood pressure.  She is exercising and is adherent to a low-salt diet.  Blood pressure is not checked at home. Cardiac symptoms: none. Patient denies: chest pain, chest pressure/discomfort, claudication, dyspnea, exertional chest pressure/discomfort, fatigue, irregular heart beat, lower extremity edema, near-syncope, orthopnea, palpitations, paroxysmal nocturnal dyspnea, syncope and tachypnea. Cardiovascular risk factors: hypertension, obesity (BMI >= 30 kg/m2) and sedentary lifestyle. Use of agents associated with hypertension: none. History of target organ damage: none.  The following portions of the patient's history were reviewed and updated as appropriate: allergies, current medications, past family history, past medical history, past social history, past surgical history and problem list.  Review of Systems Pertinent items are noted in HPI.     Objective:    BP 138/78  Pulse 131  Temp(Src) 99.6 F (37.6 C) (Oral)  Wt 230 lb 12.8 oz (104.69 kg)  SpO2 97% General appearance: alert, cooperative, appears stated age and no distress Lungs: clear to auscultation bilaterally Heart: S1, S2 normal-- tachy Extremities: extremities normal, atraumatic, no cyanosis or edema    Assessment:    Hypertension, stage 1 pt ran out of meds and restarted it today. Evidence of target organ damage: none.    Plan:    Medication: no change. Dietary sodium restriction. Regular aerobic exercise. Follow up: 2 weeks and as needed.

## 2012-02-02 ENCOUNTER — Ambulatory Visit: Payer: BC Managed Care – PPO | Admitting: Family Medicine

## 2012-02-14 ENCOUNTER — Telehealth: Payer: Self-pay | Admitting: Family Medicine

## 2012-02-14 DIAGNOSIS — F419 Anxiety disorder, unspecified: Secondary | ICD-10-CM

## 2012-02-14 NOTE — Telephone Encounter (Signed)
Refill- clonazepam 1mg  tablet. Take one tablet by mouth 3 times a day.

## 2012-02-15 MED ORDER — CLONAZEPAM 1 MG PO TABS: 1.0000 mg | ORAL_TABLET | Freq: Three times a day (TID) | ORAL | Status: DC

## 2012-02-15 NOTE — Telephone Encounter (Signed)
Faxed.   KP 

## 2012-02-15 NOTE — Telephone Encounter (Signed)
Last seen 01/19/12 and filled 01/18/12 #90. Please advise      KP

## 2012-02-15 NOTE — Telephone Encounter (Signed)
Refill x1 

## 2012-02-22 ENCOUNTER — Ambulatory Visit (INDEPENDENT_AMBULATORY_CARE_PROVIDER_SITE_OTHER): Payer: BC Managed Care – PPO | Admitting: Family Medicine

## 2012-02-22 ENCOUNTER — Encounter: Payer: Self-pay | Admitting: Family Medicine

## 2012-02-22 VITALS — BP 122/74 | HR 143 | Temp 98.2°F | Wt 228.2 lb

## 2012-02-22 DIAGNOSIS — R000 Tachycardia, unspecified: Secondary | ICD-10-CM

## 2012-02-22 DIAGNOSIS — M549 Dorsalgia, unspecified: Secondary | ICD-10-CM

## 2012-02-22 LAB — POCT URINALYSIS DIPSTICK
Blood, UA: NEGATIVE
Nitrite, UA: NEGATIVE
Protein, UA: NEGATIVE
Spec Grav, UA: 1.025
Urobilinogen, UA: 0.2

## 2012-02-22 LAB — CBC WITH DIFFERENTIAL/PLATELET
Basophils Absolute: 0 10*3/uL (ref 0.0–0.1)
Eosinophils Absolute: 0.2 10*3/uL (ref 0.0–0.7)
HCT: 37 % (ref 36.0–46.0)
Hemoglobin: 12.3 g/dL (ref 12.0–15.0)
Lymphs Abs: 1.9 10*3/uL (ref 0.7–4.0)
MCHC: 33.2 g/dL (ref 30.0–36.0)
MCV: 81.9 fl (ref 78.0–100.0)
Monocytes Absolute: 0.6 10*3/uL (ref 0.1–1.0)
Monocytes Relative: 6.5 % (ref 3.0–12.0)
Neutro Abs: 6.4 10*3/uL (ref 1.4–7.7)
Platelets: 360 10*3/uL (ref 150.0–400.0)
RDW: 15.8 % — ABNORMAL HIGH (ref 11.5–14.6)

## 2012-02-22 LAB — BASIC METABOLIC PANEL
BUN: 9 mg/dL (ref 6–23)
CO2: 24 mEq/L (ref 19–32)
Chloride: 108 mEq/L (ref 96–112)
Glucose, Bld: 114 mg/dL — ABNORMAL HIGH (ref 70–99)
Potassium: 3.4 mEq/L — ABNORMAL LOW (ref 3.5–5.1)
Sodium: 141 mEq/L (ref 135–145)

## 2012-02-22 LAB — HEPATIC FUNCTION PANEL
AST: 36 U/L (ref 0–37)
Albumin: 3.7 g/dL (ref 3.5–5.2)

## 2012-02-22 LAB — D-DIMER, QUANTITATIVE: D-Dimer, Quant: 0.4 ug/mL-FEU (ref 0.00–0.48)

## 2012-02-22 LAB — TSH: TSH: 1.25 u[IU]/mL (ref 0.35–5.50)

## 2012-02-22 MED ORDER — KETOROLAC TROMETHAMINE 60 MG/2ML IM SOLN
60.0000 mg | Freq: Once | INTRAMUSCULAR | Status: AC
  Administered 2012-02-22: 60 mg via INTRAMUSCULAR

## 2012-02-22 MED ORDER — PROMETHAZINE HCL 25 MG PO TABS: 25.0000 mg | ORAL_TABLET | Freq: Three times a day (TID) | ORAL | Status: DC | PRN

## 2012-02-22 MED ORDER — PROMETHAZINE HCL 50 MG/ML IJ SOLN
50.0000 mg | Freq: Once | INTRAMUSCULAR | Status: AC
  Administered 2012-02-22: 50 mg via INTRAMUSCULAR

## 2012-02-22 NOTE — Progress Notes (Signed)
  Subjective:    Bethany Montgomery is a 36 y.o. female who presents for evaluation of headache. Symptoms began about several days ago. Generally, the headaches last about 2 days and occur several times per month. The headaches do not seem to be related to any time of day or year. The headaches are usually pounding and throbbing and are located in front and back of head and neck.  The patient rates her most severe headaches a 6 on a scale from 1 to 10. Recently, the headaches have been decreasing in both severity and frequency. Work attendance or other daily activities are affected by the headaches. Precipitating factors include: stress. The headaches are usually not preceded by an aura. Associated neurologic symptoms: decreased physical activity, worsening school/work performance and photophobia and phonophobia. The patient denies depression, dizziness, loss of balance, muscle weakness, numbness of extremities, speech difficulties, vision problems and vomiting in the early morning. Home treatment has included botox, darkening the room, resting and sleeping with marked improvement. Other history includes: migraine headaches diagnosed in the past. Family history includes no known family members with significant headaches.  The following portions of the patient's history were reviewed and updated as appropriate: allergies, current medications, past family history, past medical history, past social history, past surgical history and problem list.  Review of Systems Pertinent items are noted in HPI.    Objective:    BP 122/74  Pulse 143  Temp(Src) 98.2 F (36.8 C) (Oral)  Wt 228 lb 3.2 oz (103.511 kg)  SpO2 99% General appearance: alert, cooperative, appears stated age and mild distress Ears: normal TM's and external ear canals both ears Lungs: clear to auscultation bilaterally Heart: S1, S2 normal Extremities: extremities normal, atraumatic, no cyanosis or edema Neurologic: Alert and oriented X 3, normal  strength and tone. Normal symmetric reflexes. Normal coordination and gait    Assessment:    Classic migraine  Tachycardia---pt has not taken toprol in 2 days-- she will take it as soon as she gets home   Plan:    Continue present treatment and plan. Lie in darkened room and apply cold packs as needed for pain. Side effect profile discussed in detail. Asked to keep headache diary. Patient reassured that neurodiagnostic workup not indicated from benign H&P. f/u neuro  Refill phenergan Phenergan and toradol given in office

## 2012-02-22 NOTE — Patient Instructions (Signed)
Tachycardia, Nonspecific  In adults, the heart normally beats between 60 and 100 times a minute. A heart rate over 100 is called tachycardia. When your heart beats too fast, it may not be able to pump enough blood to the rest of the body.  CAUSES    Exercise or exertion.   Fever.   Pain or injury.   Infection.   Loss of fluid (dehydration).   Overactive thyroid.   Lack of red blood cells (anemia).   Anxiety.   Alcohol.   Heart arrhythmia.   Caffeine.   Tobacco products.   Diet pills.   Street drugs.   Heart disease.  SYMPTOMS   Palpitations (rapid or irregular heartbeat).   Dizziness.   Tiredness (fatigue).   Shortness of breath.  DIAGNOSIS   After an exam and taking a history, your caregiver may order:   Blood tests.   Electrocardiogram (EKG).   Heart monitor.  TREATMENT   Treatment will depend on the cause and potential for harm. It may include:   Intravenous (IV) replacement of fluids or blood.   Antidote or reversal medicines.   Changes in your present medicines.   Lifestyle changes.  HOME CARE INSTRUCTIONS    Get rest.   Drink enough water and fluids to keep your urine clear or pale yellow.   Avoid:   Caffeine.   Nicotine.   Alcohol.   Stress.   Chocolate.   Stimulants.   Only take medicine as directed by your caregiver.  SEEK IMMEDIATE MEDICAL CARE IF:    You have pain in your chest, upper arms, jaw, or neck.   You become weak, dizzy, or feel faint.   You have palpitations that will not go away.   You throw up (vomit), have diarrhea, or pass blood.   You look pale and your skin is cool and wet.  MAKE SURE YOU:    Understand these instructions.   Will watch your condition.   Will get help right away if you are not doing well or get worse.  Document Released: 10/07/2004 Document Revised: 08/19/2011 Document Reviewed: 08/10/2011  ExitCare Patient Information 2012 [employer].

## 2012-02-23 ENCOUNTER — Telehealth: Payer: Self-pay | Admitting: *Deleted

## 2012-02-23 ENCOUNTER — Ambulatory Visit: Payer: BC Managed Care – PPO | Admitting: Family Medicine

## 2012-02-23 MED ORDER — NITROFURANTOIN MACROCRYSTAL 100 MG PO CAPS: 100.0000 mg | ORAL_CAPSULE | Freq: Two times a day (BID) | ORAL | Status: AC

## 2012-02-23 NOTE — Telephone Encounter (Signed)
Pt c/o increasing lower back and side pain, and some tenderness to touch since being seen in office. Pt also c/o difficulty with sitting due to the pain in lower back and side. Pt would like to know what she can do. Urine dip indicated +leukocyte and was sent for culture. .Please advise

## 2012-02-23 NOTE — Telephone Encounter (Signed)
Dr Lowne pt 

## 2012-02-23 NOTE — Telephone Encounter (Signed)
Per Dr Alwyn Ren macrodantin 100 mg bid #6. Tramadol 50 mg 1/2-1 every 6 to 8 hours #15. Due to allergy Pt can do OTC pain NSAID instead Per DR hopper.  Discuss with patient, Rx sent

## 2012-02-28 ENCOUNTER — Telehealth: Payer: Self-pay | Admitting: *Deleted

## 2012-02-28 NOTE — Telephone Encounter (Signed)
Notes Recorded by Lelon Perla, DO on 02/24/2012 at 9:20 AM Put on macrobid---if still symptomatic after abx---ov and recheck Notes Recorded by Arnette Norris, CMA on 02/22/2012 at 6:17 PM msg left on machine KP Notes Recorded by Lelon Perla, DO on 02/22/2012 at 4:38 PM Essentially normal non fasting labs    Patient aware and voiced understanding.    KP

## 2012-02-28 NOTE — Telephone Encounter (Signed)
Pt left msg on triage vmail requesting lab results.  

## 2012-03-10 ENCOUNTER — Telehealth: Payer: Self-pay | Admitting: Family Medicine

## 2012-03-10 DIAGNOSIS — F419 Anxiety disorder, unspecified: Secondary | ICD-10-CM

## 2012-03-10 MED ORDER — CLONAZEPAM 1 MG PO TABS: 1.0000 mg | ORAL_TABLET | Freq: Three times a day (TID) | ORAL | Status: DC

## 2012-03-10 NOTE — Telephone Encounter (Signed)
Refill x1 

## 2012-03-10 NOTE — Telephone Encounter (Signed)
Last seen 02/22/12 and 02/15/12 # 90.    Please advise    KP

## 2012-03-10 NOTE — Telephone Encounter (Signed)
Refill: Clonazepam 1mg  tablet. Take 1 by mouth 3 times a day.

## 2012-03-10 NOTE — Telephone Encounter (Signed)
Faxed.   KP 

## 2012-04-07 ENCOUNTER — Telehealth: Payer: Self-pay | Admitting: Family Medicine

## 2012-04-07 DIAGNOSIS — F419 Anxiety disorder, unspecified: Secondary | ICD-10-CM

## 2012-04-07 MED ORDER — CLONAZEPAM 1 MG PO TABS: 1.0000 mg | ORAL_TABLET | Freq: Three times a day (TID) | ORAL | Status: DC

## 2012-04-07 NOTE — Telephone Encounter (Signed)
Last OV 02-22-12, last filled 03-10-12 #90

## 2012-04-07 NOTE — Telephone Encounter (Signed)
Refill: Clonazepam 1mg  tablet. Take 1 tablet by mouth 3 times a day.

## 2012-04-07 NOTE — Telephone Encounter (Signed)
Refill x1 

## 2012-04-25 ENCOUNTER — Other Ambulatory Visit: Payer: Self-pay | Admitting: Family Medicine

## 2012-05-01 ENCOUNTER — Other Ambulatory Visit: Payer: Self-pay | Admitting: Family Medicine

## 2012-05-01 NOTE — Telephone Encounter (Signed)
Last seen 02/22/12 and filled 04/07/12 # 90. Please advise     KP

## 2012-05-05 ENCOUNTER — Telehealth: Payer: Self-pay | Admitting: Family Medicine

## 2012-05-05 NOTE — Telephone Encounter (Signed)
Caller: [name]/Patient; Patient Name: Bethany Montgomery; PCP: Tomasa Rand Callback Phone Number: [phone]; Call regarding: Back pain; onset 05/05/12; worsens with sitting; has been moving furniture recently, but denies any injury; All emergent symptoms of Back Symptoms protocol ruled out except "history of back pain and pain not responding to usual medications/treatment"; using Motrin for pain; care advice given; instructed to use ED/[facility] for unbearable pain over the weekend or call the office 05/08/12 for an appt

## 2012-05-22 ENCOUNTER — Telehealth: Payer: Self-pay | Admitting: Family Medicine

## 2012-05-22 NOTE — Telephone Encounter (Signed)
Received 3 pages from [employer]. Sent to Dr. Laury Axon at [facility] in [place]. 05/22/12/sd

## 2012-05-31 ENCOUNTER — Telehealth: Payer: Self-pay | Admitting: Family Medicine

## 2012-05-31 NOTE — Telephone Encounter (Signed)
Last seen 02/22/12 and filled 05/01/12 # 90. Please advise      KP

## 2012-05-31 NOTE — Telephone Encounter (Signed)
Refill: Clonazepam 1mg  tablet. Take 1 tablet by mouth 3 times a day. Qty 05-02-12

## 2012-06-01 MED ORDER — CLONAZEPAM 1 MG PO TABS: 1.0000 mg | ORAL_TABLET | Freq: Three times a day (TID) | ORAL | Status: DC | PRN

## 2012-06-01 NOTE — Telephone Encounter (Signed)
Rx sent to [facility] [address].   KP 

## 2012-06-01 NOTE — Telephone Encounter (Signed)
Refill x1 

## 2012-07-10 ENCOUNTER — Encounter: Payer: Self-pay | Admitting: Family Medicine

## 2012-07-10 ENCOUNTER — Ambulatory Visit (INDEPENDENT_AMBULATORY_CARE_PROVIDER_SITE_OTHER): Payer: BC Managed Care – PPO | Admitting: Family Medicine

## 2012-07-10 VITALS — BP 134/90 | HR 89 | Temp 98.2°F | Wt 221.0 lb

## 2012-07-10 DIAGNOSIS — F419 Anxiety disorder, unspecified: Secondary | ICD-10-CM

## 2012-07-10 DIAGNOSIS — F411 Generalized anxiety disorder: Secondary | ICD-10-CM

## 2012-07-10 DIAGNOSIS — I10 Essential (primary) hypertension: Secondary | ICD-10-CM

## 2012-07-10 MED ORDER — ESCITALOPRAM OXALATE 10 MG PO TABS: 10.0000 mg | ORAL_TABLET | Freq: Every day | ORAL | Status: DC

## 2012-07-10 MED ORDER — METOPROLOL SUCCINATE ER 100 MG PO TB24: ORAL_TABLET | ORAL | Status: DC

## 2012-07-10 NOTE — Patient Instructions (Addendum)

## 2012-07-10 NOTE — Progress Notes (Signed)
  Subjective:    Patient here for follow-up of elevated blood pressure.  She is not exercising and is adherent to a low-salt diet.  Blood pressure is not well controlled at home. Cardiac symptoms: none. Patient denies: chest pain, chest pressure/discomfort, claudication, dyspnea, exertional chest pressure/discomfort, fatigue, irregular heart beat, lower extremity edema, near-syncope, orthopnea, palpitations, paroxysmal nocturnal dyspnea, syncope and tachypnea. Cardiovascular risk factors: hypertension, obesity (BMI >= 30 kg/m2) and sedentary lifestyle. Use of agents associated with hypertension: none. History of target organ damage: none.  The following portions of the patient's history were reviewed and updated as appropriate: allergies, current medications, past family history, past medical history, past social history, past surgical history and problem list.  Review of Systems Pertinent items are noted in HPI.     Objective:    BP 134/90  Pulse 89  Temp 98.2 F (36.8 C) (Oral)  Wt 221 lb (100.245 kg)  SpO2 99% General appearance: alert, cooperative, appears stated age and no distress Lungs: clear to auscultation bilaterally Heart: S1, S2 normal psych--     Assessment:    Hypertension, stage 1 . Evidence of target organ damage: none.    Plan:    Medication: increase to metoprolol 100 mg  1 1/2 mg qd . Dietary sodium restriction. Regular aerobic exercise. Check blood pressures 2-3 times weekly and record. Follow up: 2 weeks and as needed.

## 2012-07-24 ENCOUNTER — Ambulatory Visit (INDEPENDENT_AMBULATORY_CARE_PROVIDER_SITE_OTHER): Payer: BC Managed Care – PPO | Admitting: Family Medicine

## 2012-07-24 ENCOUNTER — Encounter: Payer: Self-pay | Admitting: Family Medicine

## 2012-07-24 VITALS — BP 138/84 | HR 84 | Temp 98.2°F | Wt 225.4 lb

## 2012-07-24 DIAGNOSIS — I10 Essential (primary) hypertension: Secondary | ICD-10-CM

## 2012-07-24 DIAGNOSIS — F411 Generalized anxiety disorder: Secondary | ICD-10-CM

## 2012-07-24 DIAGNOSIS — F419 Anxiety disorder, unspecified: Secondary | ICD-10-CM

## 2012-07-24 NOTE — Patient Instructions (Addendum)

## 2012-07-24 NOTE — Progress Notes (Signed)
  Subjective:    Patient here for follow-up of elevated blood pressure and anxiety.   She is exercising and is adherent to a low-salt diet.  Blood pressure is well controlled at home. Cardiac symptoms: none. Patient denies: chest pain, chest pressure/discomfort, claudication, dyspnea, exertional chest pressure/discomfort, fatigue, irregular heart beat, lower extremity edema, near-syncope, orthopnea, palpitations, paroxysmal nocturnal dyspnea, syncope and tachypnea. Cardiovascular risk factors: none. Use of agents associated with hypertension: none. History of target organ damage: none.  The following portions of the patient's history were reviewed and updated as appropriate: allergies, current medications, past family history, past medical history, past social history, past surgical history and problem list.  Review of Systems Pertinent items are noted in HPI.     Objective:    BP 138/84  Pulse 84  Temp 98.2 F (36.8 C) (Oral)  Wt 225 lb 6.4 oz (102.241 kg)  SpO2 97% General appearance: alert, cooperative, appears stated age and no distress Neck: no adenopathy, supple, symmetrical, trachea midline and thyroid not enlarged, symmetric, no tenderness/mass/nodules Lungs: clear to auscultation bilaterally Heart: S1, S2 normal Extremities: extremities normal, atraumatic, no cyanosis or edema    Assessment:    Hypertension, normal blood pressure . Evidence of target organ damage: none.   anxiety-- con't meds Plan:    Medication: no change. Dietary sodium restriction. Regular aerobic exercise. Check blood pressures 2-3 times weekly and record. Follow up: 6 months and as needed.

## 2012-08-14 ENCOUNTER — Other Ambulatory Visit: Payer: Self-pay | Admitting: Family Medicine

## 2012-08-14 MED ORDER — CLONAZEPAM 1 MG PO TABS: 1.0000 mg | ORAL_TABLET | Freq: Three times a day (TID) | ORAL | Status: DC | PRN

## 2012-08-14 NOTE — Telephone Encounter (Signed)
done

## 2012-08-14 NOTE — Telephone Encounter (Signed)
refill ClonazePAM (Tab) 1 MG Take 1 tablet (1 mg total) by mouth 3 (three) times daily as needed for anxiety. #90 last fill 10.21.13, last ov 11.11.13

## 2012-08-14 NOTE — Telephone Encounter (Signed)
Last seen 07/24/12 and filled 06/01/12 # 90. Please advise     KP

## 2012-09-10 ENCOUNTER — Other Ambulatory Visit: Payer: Self-pay | Admitting: Internal Medicine

## 2012-10-13 ENCOUNTER — Other Ambulatory Visit: Payer: Self-pay | Admitting: Family Medicine

## 2012-10-13 MED ORDER — CLONAZEPAM 1 MG PO TABS: ORAL_TABLET | ORAL | Status: DC

## 2012-10-13 NOTE — Telephone Encounter (Signed)
Last seen 07/24/12 and filled 09/10/12 #90. Please advise    KP

## 2012-10-13 NOTE — Telephone Encounter (Signed)
REFILL ClonazePAM (Tab) 1 MG TAKE 1 TABLET BY MOUTH 3 TIMES A DAY AS NEEDED FOR ANXIETY NO QTY LISTED last fill 12.31.13

## 2012-10-13 NOTE — Telephone Encounter (Signed)
Refill x1   2 refills 

## 2012-11-03 ENCOUNTER — Telehealth: Payer: Self-pay | Admitting: *Deleted

## 2012-11-03 NOTE — Telephone Encounter (Signed)
Metoprolol tartat 50 mg bid  #60 ----she will need to be seen in 2-3 weeks to make sure its working

## 2012-11-03 NOTE — Telephone Encounter (Signed)
Pt would like to changed to the plain metoprolol instead of the XR because it is on a higher tier which cost her more money. Pt indicated that she is in to process of a divorce so money is a issue right now so she would like a cheaper BP med if possible. Please advise

## 2012-11-06 MED ORDER — METOPROLOL TARTRATE 50 MG PO TABS: 50.0000 mg | ORAL_TABLET | Freq: Two times a day (BID) | ORAL | Status: DC

## 2012-11-06 NOTE — Telephone Encounter (Signed)
Dicussed with patient and she voiced understanding, Rx sent to [facility] per patient request and apt scheduled for 11/20/12 at 11 am.       KP

## 2012-11-20 ENCOUNTER — Ambulatory Visit: Payer: BC Managed Care – PPO | Admitting: Family Medicine

## 2012-11-20 ENCOUNTER — Encounter: Payer: Self-pay | Admitting: *Deleted

## 2012-11-23 ENCOUNTER — Encounter: Payer: Self-pay | Admitting: Lab

## 2012-11-23 ENCOUNTER — Encounter: Payer: Self-pay | Admitting: Family Medicine

## 2012-11-23 ENCOUNTER — Ambulatory Visit (INDEPENDENT_AMBULATORY_CARE_PROVIDER_SITE_OTHER): Payer: BC Managed Care – PPO | Admitting: Family Medicine

## 2012-11-23 VITALS — BP 146/94 | HR 88 | Temp 98.5°F | Wt 224.0 lb

## 2012-11-23 DIAGNOSIS — G43909 Migraine, unspecified, not intractable, without status migrainosus: Secondary | ICD-10-CM | POA: Insufficient documentation

## 2012-11-23 DIAGNOSIS — F332 Major depressive disorder, recurrent severe without psychotic features: Secondary | ICD-10-CM

## 2012-11-23 MED ORDER — CYCLOBENZAPRINE HCL 10 MG PO TABS: 10.0000 mg | ORAL_TABLET | Freq: Three times a day (TID) | ORAL | Status: DC | PRN

## 2012-11-23 MED ORDER — PROMETHAZINE HCL 50 MG/ML IJ SOLN
50.0000 mg | Freq: Once | INTRAMUSCULAR | Status: AC
  Administered 2012-11-23: 50 mg via INTRAMUSCULAR

## 2012-11-23 MED ORDER — TOPIRAMATE 25 MG PO TABS: ORAL_TABLET | ORAL | Status: AC

## 2012-11-23 MED ORDER — KETOROLAC TROMETHAMINE 60 MG/2ML IM SOLN
60.0000 mg | Freq: Once | INTRAMUSCULAR | Status: AC
  Administered 2012-11-23: 60 mg via INTRAMUSCULAR

## 2012-11-23 NOTE — Patient Instructions (Signed)
Suicidal Feelings, How to Help Yourself  Everyone feels sad or unhappy at times, but depressing thoughts and feelings of hopelessness can lead to thoughts of suicide. It can seem as if life is too tough to handle. If you feel as though you have reached the point where suicide is the only answer, it is time to let someone know immediately.   HOW TO COPE AND PREVENT SUICIDE   Let family, friends, teachers, or counselors know. Get help. Try not to isolate yourself from those who care about you. Even though you may not feel sociable, talk with someone every day. It is best if it is face-to-face. Remember, they will want to help you.   Eat a regularly spaced and well-balanced diet.   Get plenty of rest.   Avoid alcohol and drugs because they will only make you feel worse and may also lower your inhibitions. Remove them from the home. If you are thinking of taking an overdose of your prescribed medicines, give your medicines to someone who can give them to you one day at a time. If you are on antidepressants, let your caregiver know of your feelings so he or she can provide a safer medicine, if that is a concern.   Remove weapons or poisons from your home.   Try to stick to routines. Follow a schedule and remind yourself that you have to keep that schedule every day.   Set some realistic goals and achieve them. Make a list and cross things off as you go. Accomplishments give a sense of worth. Wait until you are feeling better before doing things you find difficult or unpleasant to do.   If you are able, try to start exercising. Even half-hour periods of exercise each day will make you feel better. Getting out in the sun or into nature helps you recover from depression faster. If you have a favorite place to walk, take advantage of that.   Increase safe activities that have always given you pleasure. This may include playing your favorite music, reading a good book, painting a picture, or playing your favorite  instrument. Do whatever takes your mind off your depression.   Keep your living space well-lighted.  GET HELP  Contact a suicide hotline, crisis center, or local suicide prevention center for help right away. Local centers may include a hospital, clinic, community service organization, social service provider, or health department.   Call your local emergency services (911 in the [place]).   Call a suicide hotline:   1-800-273-TALK (1-800-273-8255) in the [place].   1-800-SUICIDE (1-800-784-2433) in the [place].   1-888-628-9454 in the [place] for Spanish-speaking counselors.   1-800-799-4TTY (1-800-799-4889) in the [place] for TTY users.   Visit the following websites for information and help:   National Suicide Prevention Lifeline: www.suicidepreventionlifeline.org   Hopeline: www.hopeline.com   American Foundation for Suicide Prevention: www.afsp.org   For lesbian, gay, bisexual, transgender, or questioning youth, contact The [employer] Project:   1-866-4-U-TREVOR (1-866-488-7386) in the [place].   www.thetrevorproject.org   In [place], treatment resources are listed in each province with listings available under [employer] or similar titles. Another source for Crisis Centres by Province is located at http://www.suicideprevention.ca/in-crisis-now/find-a-crisis-centre-now/crisis-centres  Document Released: 03/06/2003 Document Revised: 11/22/2011 Document Reviewed: 07/25/2007  ExitCare Patient Information 2013 [employer].

## 2012-11-23 NOTE — Assessment & Plan Note (Signed)
Restart topamax Refer to psych Pt going to [facility] ER fo be evaluated by [facility]

## 2012-11-23 NOTE — Assessment & Plan Note (Signed)
Restart topamax toradol IM given

## 2012-11-23 NOTE — Progress Notes (Signed)
  Subjective:    Patient ID: Bethany Montgomery, female    DOB: 12/07/1975, 37 y.o.   MRN: [id number]  HPI Pt is here with her neighbor because of a migraine that will not let up but as soon as she got into exam room she started sobbing hysterically and is would not stop.  She was barely able to tell me what is wrong.  She is in the middle of a divorce but her husband will not leave the house and she is under extreme stress at work.   She was to [facility] and they wanted to keep her but she would not stay.  When asked it pt would hurt herself she said "I don't know,  I just don't know" , while sobbing.   Pt has been on several antidepressants and had to stop all secondary to side effects.  The only one that helped was topamax----she can't remember    Review of Systems As above    Objective:   Physical Exam  Constitutional: She is oriented to person, place, and time.  Neurological: She is alert and oriented to person, place, and time. No cranial nerve deficit. Coordination normal.  Psychiatric:  +sobbing uncontrollable  Pt may be suicidal jj   BP 146/94  Pulse 88  Temp(Src) 98.5 F (36.9 C) (Oral)  Wt 224 lb (101.606 kg)  BMI 37.28 kg/m2  SpO2 99% General appearance: alert, cooperative, appears stated age and no distress Eyes: conjunctivae/corneas clear. PERRL, EOM's intact. Fundi benign.        Assessment & Plan:

## 2012-11-30 ENCOUNTER — Telehealth: Payer: Self-pay | Admitting: Family Medicine

## 2012-11-30 NOTE — Telephone Encounter (Signed)
Patient states she needs FMLA paperwork completed. Please advise.

## 2012-12-01 NOTE — Telephone Encounter (Signed)
Discussed with patient and she will have the [facility] paperwork sent as well as the notes from her Therapist      KP

## 2012-12-12 ENCOUNTER — Encounter: Payer: Self-pay | Admitting: Lab

## 2012-12-13 ENCOUNTER — Encounter: Payer: Self-pay | Admitting: Family Medicine

## 2012-12-13 ENCOUNTER — Ambulatory Visit (INDEPENDENT_AMBULATORY_CARE_PROVIDER_SITE_OTHER): Payer: BC Managed Care – PPO | Admitting: Family Medicine

## 2012-12-13 VITALS — BP 142/88 | HR 84 | Temp 98.5°F | Wt 218.4 lb

## 2012-12-13 DIAGNOSIS — F329 Major depressive disorder, single episode, unspecified: Secondary | ICD-10-CM

## 2012-12-13 DIAGNOSIS — F3289 Other specified depressive episodes: Secondary | ICD-10-CM

## 2012-12-13 DIAGNOSIS — J40 Bronchitis, not specified as acute or chronic: Secondary | ICD-10-CM

## 2012-12-13 MED ORDER — ALBUTEROL SULFATE HFA 108 (90 BASE) MCG/ACT IN AERS: 2.0000 | INHALATION_SPRAY | Freq: Four times a day (QID) | RESPIRATORY_TRACT | Status: AC | PRN

## 2012-12-13 NOTE — Patient Instructions (Addendum)

## 2012-12-13 NOTE — Progress Notes (Signed)
  Subjective:   Bethany Montgomery is an 37 y.o. female who presents for evaluation and treatment of depressive symptoms.  Onset approximately several weeks ago, gradually worsening since that time.  Current symptoms include depressed mood, insomnia, fatigue, feelings of worthlessness/guilt, difficulty concentrating, hopelessness, anxiety, loss of energy/fatigue, weight loss,.  Current treatment for depression:Individual therapy and Medication Sleep problems: Marked   Early awakening:Absent   Energy: Poor Motivation: Poor Concentration: Poor Rumination/worrying: Severe Memory: Fair Tearfulness: Severe  Anxiety: Marked  Panic: Moderate  Overall Mood: Severely worse  Hopelessness: Marked Suicidal ideation: Absent  Other/Psychosocial Stressors: marriage problems--pt now separated and work stessors Family history positive for depression in the patient's unknown.  Previous treatment modalities employed include Individual therapy and Medication.  Past episodes of depression:yes Organic causes of depression present: None.  Review of Systems Pertinent items are noted in HPI.   Objective:   Mental Status Examination: Posture and motor behavior: Appropriate Dress, grooming, personal hygiene: Appropriate Facial expression: Appropriate Speech: Appropriate Mood: very depressed and crying Coherency and relevance of thought: Appropriate Thought content: Appropriate Perceptions: Appropriate Orientation:Appropriate Attention and concentration: decreased Memory: : Appropriate Information: Not examined Vocabulary: Appropriate Abstract reasoning: Appropriate Judgment: Appropriate    Assessment:   Experiencing the following symptoms of depression most of the day nearly every day for more than two consecutive weeks: depressed mood, loss of energy, trouble concentrating, thoughts of worthlessness or guilt  Depressive Disorder:with anxiety, ocd, stress  Suicide Risk Assessment:  Suicidal  intent: no Suicidal plan: no Access to means for suicide: yes Lethality of means for suicide: no Prior suicide attempts: no Recent exposure to suicide:she was suicidal in previous visit     Plan:    Reviewed concept of depression as biochemical imbalance of neurotransmitters and rationale for treatment. Instructed patient to contact office or on-call physician promptly should condition worsen or any new symptoms appear and provided on-call telephone numbers.    FMLA papers filled out

## 2012-12-15 ENCOUNTER — Telehealth: Payer: Self-pay | Admitting: *Deleted

## 2012-12-15 NOTE — Telephone Encounter (Signed)
error 

## 2012-12-20 ENCOUNTER — Other Ambulatory Visit: Payer: Self-pay | Admitting: Family Medicine

## 2012-12-21 ENCOUNTER — Ambulatory Visit (INDEPENDENT_AMBULATORY_CARE_PROVIDER_SITE_OTHER): Payer: BC Managed Care – PPO | Admitting: Family Medicine

## 2012-12-21 ENCOUNTER — Encounter: Payer: Self-pay | Admitting: Family Medicine

## 2012-12-21 ENCOUNTER — Encounter: Payer: Self-pay | Admitting: Lab

## 2012-12-21 VITALS — BP 128/90 | HR 114 | Temp 98.2°F | Wt 218.2 lb

## 2012-12-21 DIAGNOSIS — J45901 Unspecified asthma with (acute) exacerbation: Secondary | ICD-10-CM

## 2012-12-21 MED ORDER — MOMETASONE FURO-FORMOTEROL FUM 100-5 MCG/ACT IN AERO: INHALATION_SPRAY | RESPIRATORY_TRACT | Status: AC

## 2012-12-21 NOTE — Patient Instructions (Addendum)

## 2012-12-21 NOTE — Progress Notes (Signed)
  Subjective:     Bethany Montgomery is a 37 y.o. female here for evaluation of a cough. Onset of symptoms was 2 days ago. Symptoms have been gradually worsening since that time. The cough is barky and dry and is aggravated by cold air, dust, pollens and reclining position. Associated symptoms include: shortness of breath and wheezing. Patient does have a history of asthma. Patient does have a history of environmental allergens. Patient has not traveled recently. Patient does not have a history of smoking. Patient has not had a previous chest x-ray. Patient has not had a PPD done.  The following portions of the patient's history were reviewed and updated as appropriate: allergies, current medications, past family history, past medical history, past social history, past surgical history and problem list.  Review of Systems Pertinent items are noted in HPI.    Objective:    Oxygen saturation 97% on room air BP 128/90  Pulse 114  Temp(Src) 98.2 F (36.8 C) (Oral)  Wt 218 lb 3.2 oz (98.975 kg)  BMI 36.31 kg/m2  SpO2 97% General appearance: alert, cooperative, appears stated age and no distress Ears: normal TM's and external ear canals both ears Nose: Nares normal. Septum midline. Mucosa normal. No drainage or sinus tenderness. Throat: lips, mucosa, and tongue normal; teeth and gums normal Neck: no adenopathy, supple, symmetrical, trachea midline and thyroid not enlarged, symmetric, no tenderness/mass/nodules Lungs: diminished breath sounds bibasilar Heart: S1, S2 normal    Assessment:    Asthma    Plan:    Explained lack of efficacy of antibiotics in viral disease. Avoid exposure to tobacco smoke and fumes. B-agonist inhaler. Call if shortness of breath worsens, blood in sputum, change in character of cough, development of fever or chills, inability to maintain nutrition and hydration. Avoid exposure to tobacco smoke and fumes. Steroid inhaler as ordered.

## 2012-12-25 ENCOUNTER — Telehealth: Payer: Self-pay | Admitting: Family Medicine

## 2012-12-25 NOTE — Telephone Encounter (Signed)
Patient states that Dr. Laury Axon had filled out her FMLA forms and said that if patient did not feel any better to call and she would extend her FMLA out? Patient would like Dr. Laury Axon to extend her FMLA out for another week.

## 2012-12-25 NOTE — Telephone Encounter (Signed)
Ok to extend 1 week 

## 2012-12-25 NOTE — Telephone Encounter (Signed)
Please advise      KP 

## 2012-12-26 NOTE — Telephone Encounter (Signed)
Work note complete and left at check in. The patient will come and pick it up today.     KP

## 2013-01-01 ENCOUNTER — Telehealth: Payer: Self-pay | Admitting: Family Medicine

## 2013-01-01 NOTE — Telephone Encounter (Signed)
This is from Friday-- For MD to review

## 2013-01-01 NOTE — Telephone Encounter (Signed)
[facility] Triage Call Report Triage Record Num: [id number] Operator: Tomasita Crumble Patient Name: Bethany Montgomery Call Date & Time: 12/29/2012 9:14:50AM Patient Phone: [phone] PCP: Lelon Perla Patient Gender: Female PCP Fax : [phone] Patient DOB: 1976-09-01 Practice Name: [facility] Reason for Call: Caller: [name]/Patient; PCP: Lelon Perla.; CB#: [id number]; Call regarding Cough/Congestion; Afebrile/tactile. LMP - unknown; has IUD. C/o body aches from cough. Very hoarse; Seen 12/21/12 for Asthma. See Provider within 4 hours pre Asthma- Adult for New onset or worsening cough AND asthma with increasing frequency of flare-ups since last scheduled appointment. Caller declined appointment. Advised to use Albuterol /Xopenex q4h around the clock for 24 hours in addition to the steroid inhaler. Increase liquid intake, avoid irritants and reinforced need to see MD within 4 hours. Caller declined appointment again. Protocol(s) Used: Asthma - Adult Recommended Outcome per Protocol: See Provider within 4 hours Reason for Outcome: New onset or worsening cough AND asthma with increasing frequency of flare-ups since last scheduled appointment Care Advice: ~ Another adult should drive. Limit or avoid exposure to irritants and allergens (e.g. air pollution, smoke/smoking, chemicals, dust, pollen, pet dander, etc.) ~ ~ Call provider if symptoms worsen or new symptoms develop. ~ Avoid any activity that produces symptoms until evaluated by provider. Call EMS 911 if sudden worsening of breathing problems, struggling to breathe, high pitched noise when breathing in (stridor), unable to speak, grasping at throat, or panic/anxiety because of breathing problems. ~ ~ Use prescribed rescue medication (inhaler, nebulizer) as directed. ~ SYMPTOM / CONDITION MANAGEMENT ~ CAUTIONS ~ List, or take, all current prescription(s), nonprescription or alternative medication(s) to  provider for evaluation. 12/29/2012 9:31:40AM Page 1 of 1 [id number]

## 2013-01-01 NOTE — Telephone Encounter (Signed)
Pt really needs to be seen

## 2013-01-01 NOTE — Telephone Encounter (Signed)
Patient states she cannot come in for a visit bc she cannot miss anymore work. Patient states she will go to urgent care.

## 2013-01-19 ENCOUNTER — Other Ambulatory Visit: Payer: Self-pay | Admitting: Family Medicine

## 2013-01-23 ENCOUNTER — Telehealth: Payer: Self-pay | Admitting: *Deleted

## 2013-01-23 MED ORDER — CLONAZEPAM 1 MG PO TABS: ORAL_TABLET | ORAL | Status: DC

## 2013-01-23 NOTE — Telephone Encounter (Signed)
Last OV 12-21-12, last filled 10-13-12 #90 2

## 2013-01-23 NOTE — Telephone Encounter (Signed)
Refill x1 

## 2013-02-22 ENCOUNTER — Other Ambulatory Visit: Payer: Self-pay | Admitting: Family Medicine

## 2013-02-23 NOTE — Telephone Encounter (Signed)
Last seen 12/21/12 and filled 01/23/13 #90. Please advise     KP 

## 2013-02-23 NOTE — Telephone Encounter (Signed)
Last seen 12/21/12 and filled 01/23/13 #90. Please advise     KP

## 2013-03-30 ENCOUNTER — Other Ambulatory Visit: Payer: Self-pay | Admitting: Family Medicine

## 2013-03-30 NOTE — Telephone Encounter (Signed)
Last seen 12/21/12 and filled 02/22/13 #90.       KP

## 2013-05-01 ENCOUNTER — Ambulatory Visit (INDEPENDENT_AMBULATORY_CARE_PROVIDER_SITE_OTHER): Payer: BC Managed Care – PPO | Admitting: Family Medicine

## 2013-05-01 ENCOUNTER — Encounter: Payer: Self-pay | Admitting: Family Medicine

## 2013-05-01 VITALS — BP 160/90 | HR 84 | Temp 98.3°F | Ht 65.0 in | Wt 210.2 lb

## 2013-05-01 DIAGNOSIS — I10 Essential (primary) hypertension: Secondary | ICD-10-CM

## 2013-05-01 DIAGNOSIS — G43909 Migraine, unspecified, not intractable, without status migrainosus: Secondary | ICD-10-CM

## 2013-05-01 MED ORDER — METOPROLOL TARTRATE 100 MG PO TABS: 100.0000 mg | ORAL_TABLET | Freq: Two times a day (BID) | ORAL | Status: AC

## 2013-05-01 MED ORDER — KETOROLAC TROMETHAMINE 60 MG/2ML IM SOLN
60.0000 mg | Freq: Once | INTRAMUSCULAR | Status: AC
  Administered 2013-05-01: 60 mg via INTRAMUSCULAR

## 2013-05-01 MED ORDER — CYCLOBENZAPRINE HCL 10 MG PO TABS: ORAL_TABLET | ORAL | Status: DC

## 2013-05-01 NOTE — Patient Instructions (Signed)
Migraine Headache A migraine headache is an intense, throbbing pain on one or both sides of your head. A migraine can last for 30 minutes to several hours. CAUSES  The exact cause of a migraine headache is not always known. However, a migraine may be caused when nerves in the brain become irritated and release chemicals that cause inflammation. This causes pain. SYMPTOMS  Pain on one or both sides of your head.  Pulsating or throbbing pain.  Severe pain that prevents daily activities.  Pain that is aggravated by any physical activity.  Nausea, vomiting, or both.  Dizziness.  Pain with exposure to bright lights, loud noises, or activity.  General sensitivity to bright lights, loud noises, or smells. Before you get a migraine, you may get warning signs that a migraine is coming (aura). An aura may include:  Seeing flashing lights.  Seeing bright spots, halos, or zig-zag lines.  Having tunnel vision or blurred vision.  Having feelings of numbness or tingling.  Having trouble talking.  Having muscle weakness. MIGRAINE TRIGGERS  Alcohol.  Smoking.  Stress.  Menstruation.  Aged cheeses.  Foods or drinks that contain nitrates, glutamate, aspartame, or tyramine.  Lack of sleep.  Chocolate.  Caffeine.  Hunger.  Physical exertion.  Fatigue.  Medicines used to treat chest pain (nitroglycerine), birth control pills, estrogen, and some blood pressure medicines. DIAGNOSIS  A migraine headache is often diagnosed based on:  Symptoms.  Physical examination.  A CT scan or MRI of your head. TREATMENT Medicines may be given for pain and nausea. Medicines can also be given to help prevent recurrent migraines.  HOME CARE INSTRUCTIONS  Only take over-the-counter or prescription medicines for pain or discomfort as directed by your caregiver. The use of long-term narcotics is not recommended.  Lie down in a dark, quiet room when you have a migraine.  Keep a journal  to find out what may trigger your migraine headaches. For example, write down:  What you eat and drink.  How much sleep you get.  Any change to your diet or medicines.  Limit alcohol consumption.  Quit smoking if you smoke.  Get 7 to 9 hours of sleep, or as recommended by your caregiver.  Limit stress.  Keep lights dim if bright lights bother you and make your migraines worse. SEEK IMMEDIATE MEDICAL CARE IF:   Your migraine becomes severe.  You have a fever.  You have a stiff neck.  You have vision loss.  You have muscular weakness or loss of muscle control.  You start losing your balance or have trouble walking.  You feel faint or pass out.  You have severe symptoms that are different from your first symptoms. MAKE SURE YOU:   Understand these instructions.  Will watch your condition.  Will get help right away if you are not doing well or get worse. Document Released: 08/30/2005 Document Revised: 11/22/2011 Document Reviewed: 08/20/2011 Exit[employer] Patient Information 2014 [employer].  

## 2013-05-02 ENCOUNTER — Encounter: Payer: Self-pay | Admitting: Family Medicine

## 2013-05-02 DIAGNOSIS — E669 Obesity, unspecified: Secondary | ICD-10-CM | POA: Insufficient documentation

## 2013-05-02 NOTE — Progress Notes (Signed)
  Subjective:    Patient here for follow-up of elevated blood pressure.  She is exercising and is adherent to a low-salt diet.  Blood pressure is not well controlled at home. Cardiac symptoms: headache. Patient denies: chest pain, chest pressure/discomfort, claudication, dyspnea, exertional chest pressure/discomfort, fatigue, irregular heart beat, lower extremity edema, near-syncope, orthopnea, palpitations, paroxysmal nocturnal dyspnea, syncope and tachypnea. Cardiovascular risk factors: hypertension, obesity (BMI >= 30 kg/m2) and sedentary lifestyle. Use of agents associated with hypertension: none. History of target organ damage: none.  The following portions of the patient's history were reviewed and updated as appropriate: allergies, current medications, past family history, past medical history, past social history, past surgical history and problem list.  Review of Systems Pertinent items are noted in HPI.     Objective:    BP 160/90  Pulse 84  Temp(Src) 98.3 F (36.8 C) (Oral)  Ht 5\' 5"  (1.651 m)  Wt 210 lb 3.2 oz (95.346 kg)  BMI 34.98 kg/m2  SpO2 98% General appearance: alert, cooperative, appears stated age and no distress Eyes: conjunctivae/corneas clear. PERRL, EOM's intact. Fundi benign. Neck: no adenopathy, supple, symmetrical, trachea midline and thyroid not enlarged, symmetric, no tenderness/mass/nodules Lungs: clear to auscultation bilaterally Heart: S1, S2 normal Extremities: extremities normal, atraumatic, no cyanosis or edema Neurologic: Alert and oriented X 3, normal strength and tone. Normal symmetric reflexes. Normal coordination and gait    Assessment:    Hypertension, stage 1 . Evidence of target organ damage: none.    Plan:    Medication: increase to metoprolol. Dietary sodium restriction. Regular aerobic exercise. Check blood pressures 2-3 times weekly and record. Follow up: 2 weeks and as needed.

## 2013-05-02 NOTE — Assessment & Plan Note (Signed)
toradol given in office Muscle relaxer May be related to [facility]

## 2013-05-30 ENCOUNTER — Other Ambulatory Visit: Payer: Self-pay | Admitting: Family Medicine

## 2013-05-30 MED ORDER — CLONAZEPAM 1 MG PO TABS: ORAL_TABLET | ORAL | Status: DC

## 2013-05-30 NOTE — Telephone Encounter (Signed)
Last filled 03/30/13 #90 and seen 05/01/13 No UDS on file. Please advise     KP

## 2013-05-30 NOTE — Telephone Encounter (Signed)
Patient is calling to see if we can urgently authorize her anxiety refill for today. Patient says she is going through a divorce and is having a really rough time today.

## 2013-05-30 NOTE — Addendum Note (Signed)
Addended by: Arnette Norris on: 05/30/2013 03:31 PM   Modules accepted: Orders

## 2013-06-26 IMAGING — CR DG CHEST 2V
2 series · 2 of 2 positions shown · non-contrast
Comparison: 10/07/2011

CLINICAL DATA: Pneumonia

CHEST - 2 VIEW

[w chest pa]
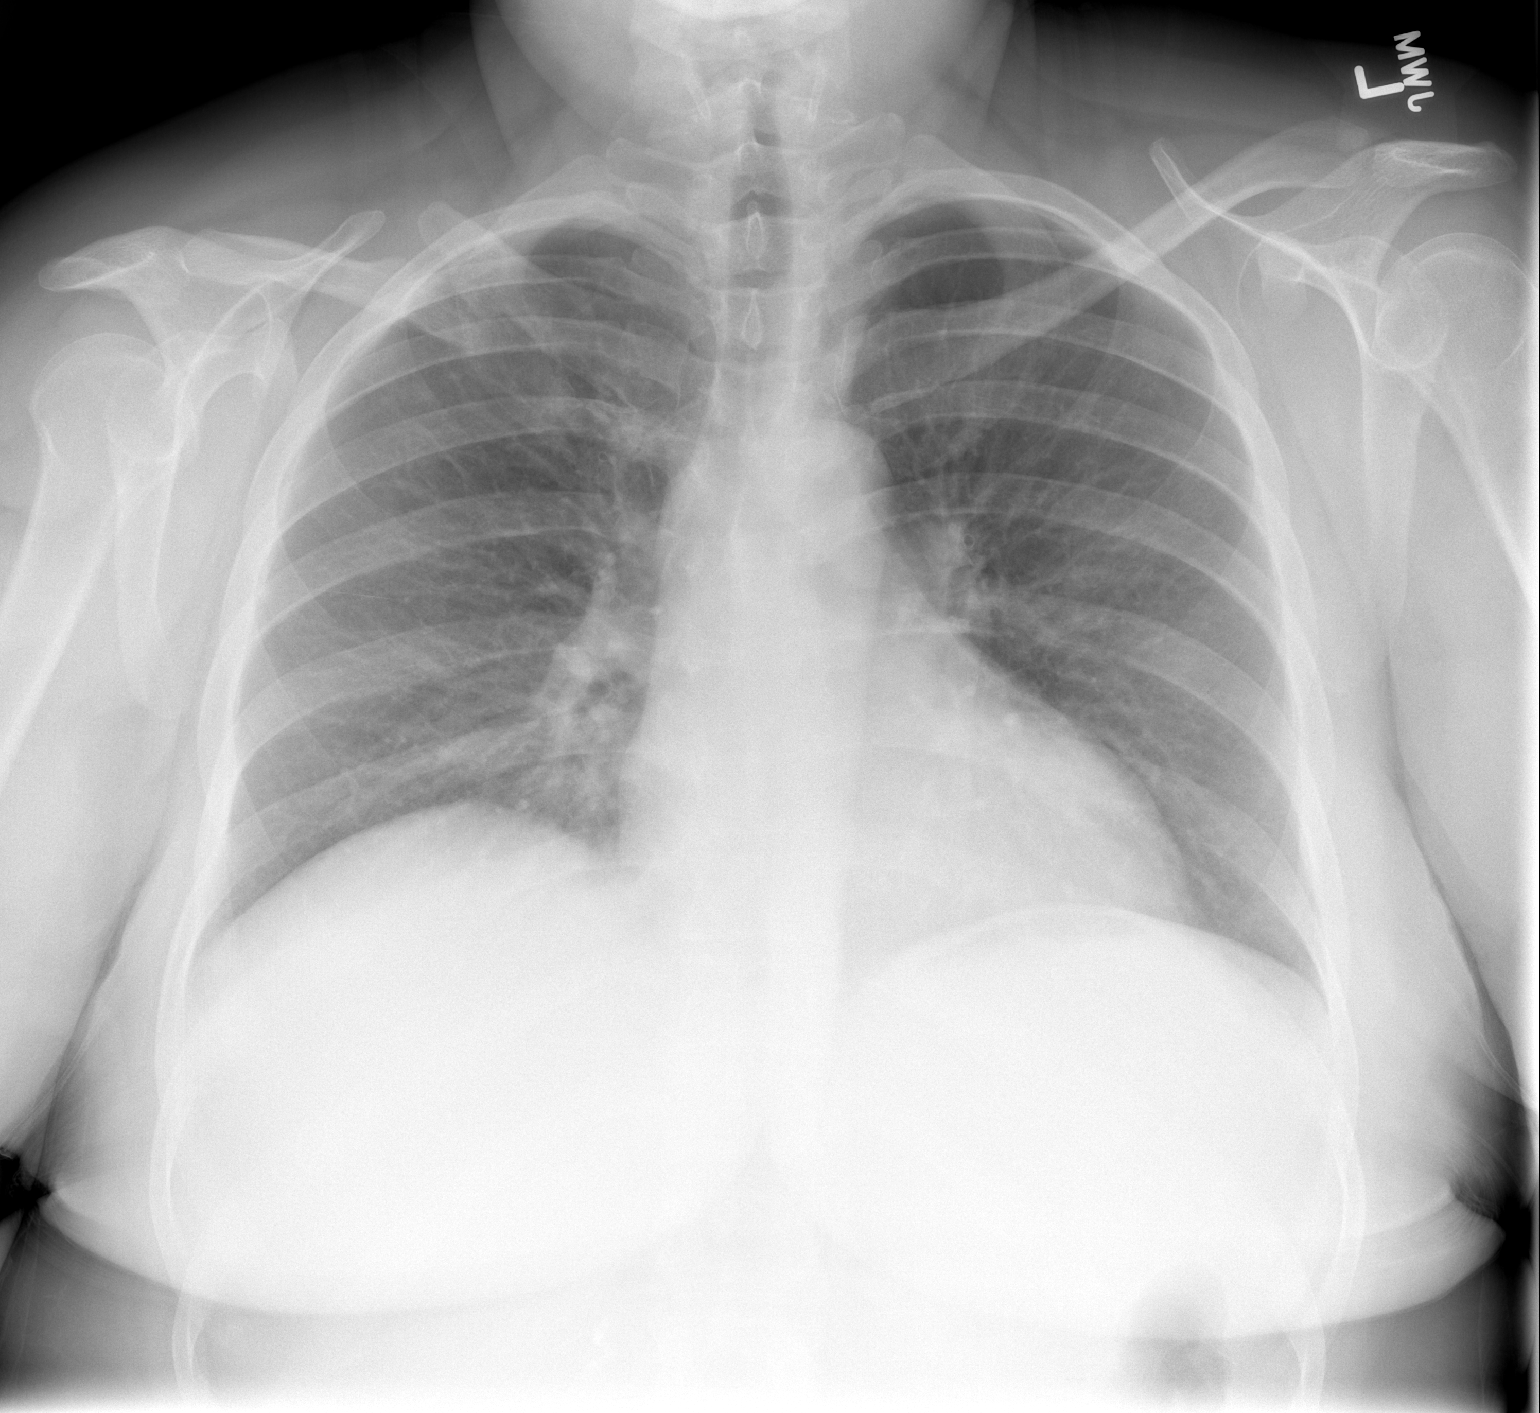

[w chest lat]
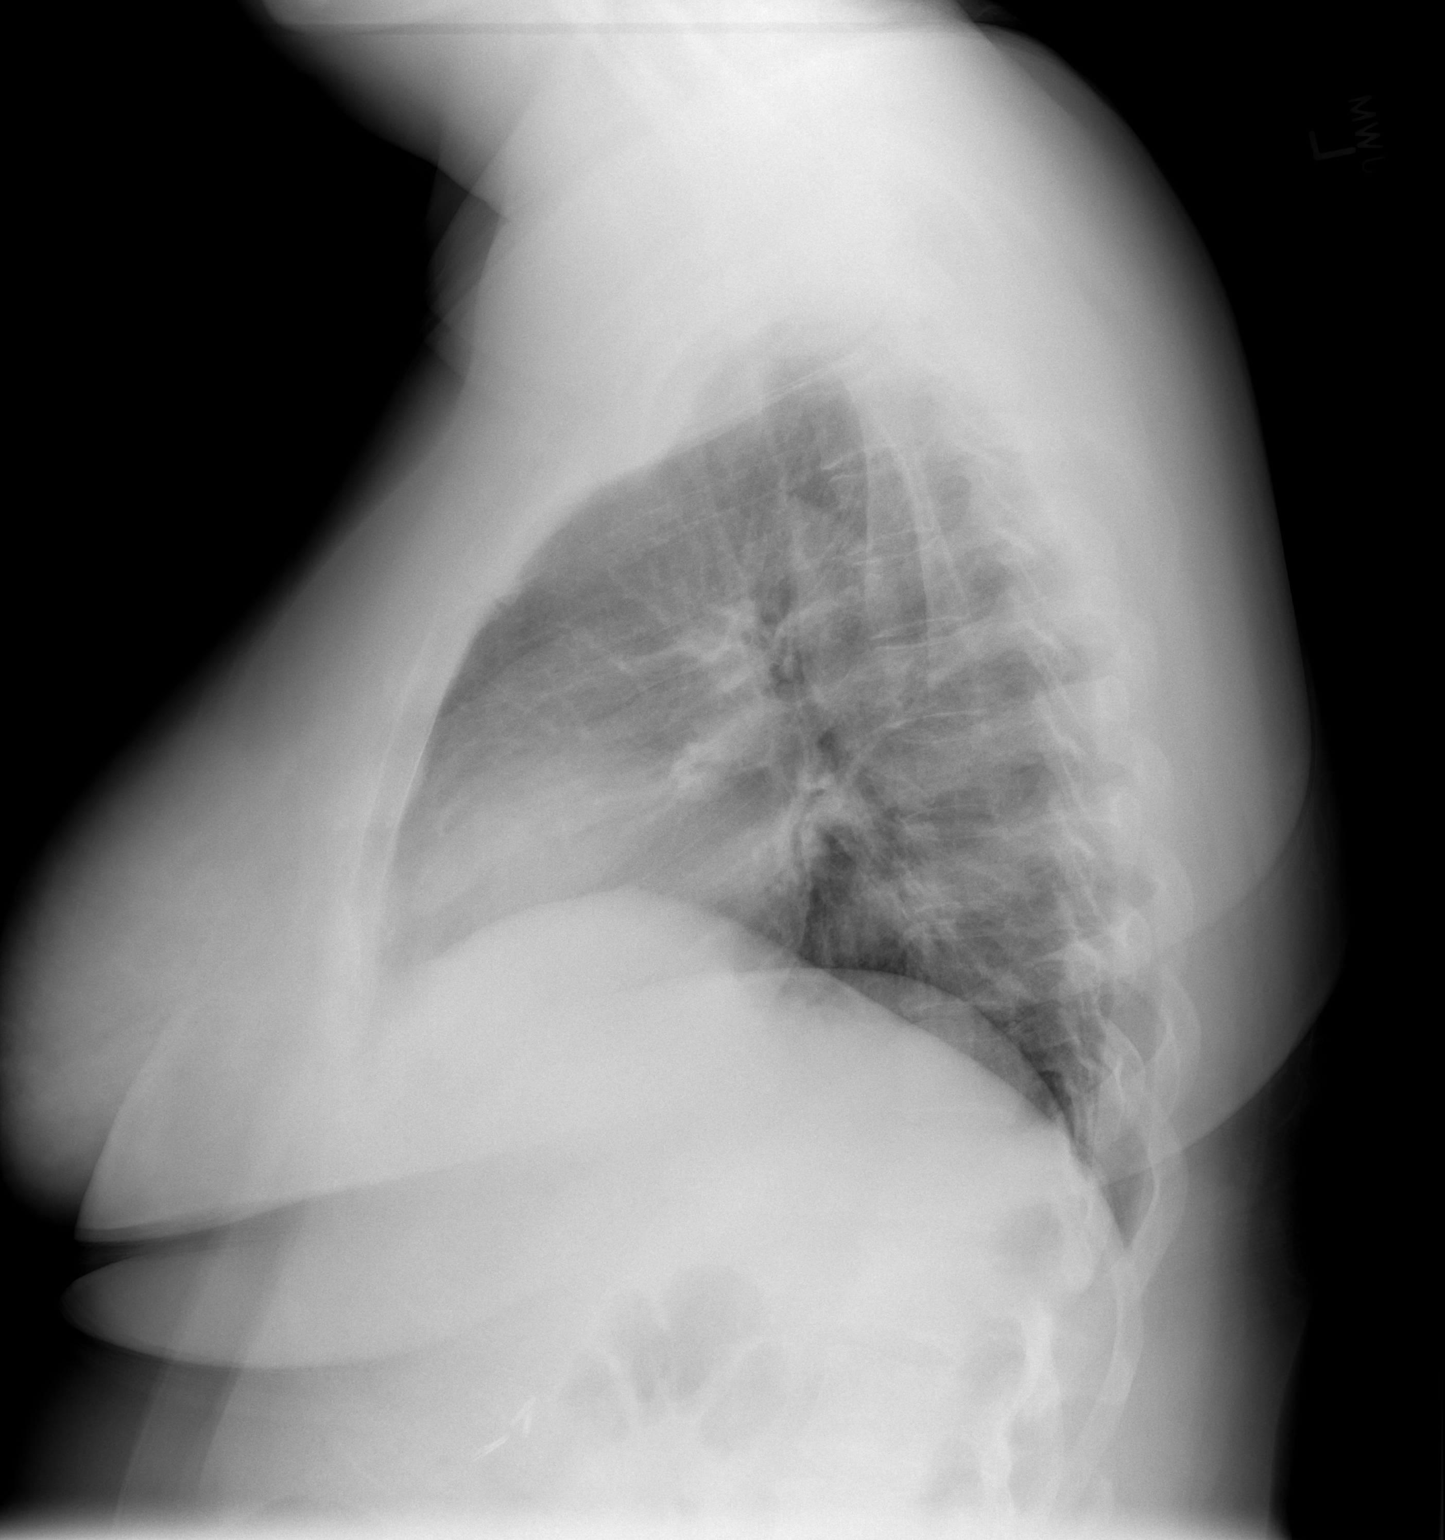

[2 of 2 positions shown; findings below may reference images not displayed]

FINDINGS: Cardiomediastinal silhouette is stable. No acute
infiltrate or pulmonary edema.  Bony thorax is stable.  Stable old
fracture deformity of the right clavicle.
IMPRESSION: No active disease.  No significant change.

## 2013-06-27 ENCOUNTER — Telehealth: Payer: Self-pay | Admitting: Family Medicine

## 2013-06-27 NOTE — Telephone Encounter (Signed)
Patient called to request a refill for cyclobenzaprine (FLEXERIL) 10 MG tablet and clonazePAM (KLONOPIN) 1 MG tablet thanks   Pharmacy [facility] on [address]

## 2013-06-27 NOTE — Telephone Encounter (Signed)
Last seen 05/01/13 and filled 05/30/13#30. No UDS on file.  Please advise     KP

## 2013-06-28 MED ORDER — CYCLOBENZAPRINE HCL 10 MG PO TABS: ORAL_TABLET | ORAL | Status: DC

## 2013-06-28 MED ORDER — CLONAZEPAM 1 MG PO TABS: ORAL_TABLET | ORAL | Status: DC

## 2013-06-28 NOTE — Telephone Encounter (Signed)
Needs uds -- explain to pt and ok to refill x1

## 2013-06-28 NOTE — Telephone Encounter (Signed)
Vm left advising the patient to pick up at the office      KP

## 2013-07-30 ENCOUNTER — Other Ambulatory Visit: Payer: Self-pay | Admitting: Family Medicine

## 2013-07-30 NOTE — Telephone Encounter (Signed)
Last seen 05/01/13 and both filled 06/28/13 #90. Please advise     KP

## 2013-08-03 ENCOUNTER — Encounter: Payer: Self-pay | Admitting: Family Medicine

## 2013-08-16 ENCOUNTER — Telehealth: Payer: Self-pay | Admitting: *Deleted

## 2013-08-16 NOTE — Telephone Encounter (Signed)
Phenergan suppository 25 mg one every 6-8 hours as needed dispense 3

## 2013-08-16 NOTE — Telephone Encounter (Signed)
Patient states that she suffers from severe migraines. Patient states that she is very nauseated when she has a migraine and would like to know if she can get a prescription for phenergan for nausea. Please advise

## 2013-08-29 ENCOUNTER — Other Ambulatory Visit: Payer: Self-pay | Admitting: *Deleted

## 2013-08-29 MED ORDER — CLONAZEPAM 1 MG PO TABS: ORAL_TABLET | ORAL | Status: DC

## 2013-08-29 NOTE — Telephone Encounter (Signed)
Last seen-05/01/2013  Last filled-07/30/2013  UDS-07/02/2013 low risk, contract signed   Please advise. SW

## 2013-08-30 NOTE — Telephone Encounter (Signed)
Med filled and patient notified.

## 2013-09-28 ENCOUNTER — Other Ambulatory Visit: Payer: Self-pay | Admitting: Family Medicine

## 2013-10-01 NOTE — Telephone Encounter (Signed)
Last seen 05/01/13 and filled 08/29/13 #90.   Uds 07/02/13 Low risk   Please advise     KP

## 2013-10-05 ENCOUNTER — Other Ambulatory Visit: Payer: Self-pay | Admitting: Family Medicine

## 2013-10-09 ENCOUNTER — Telehealth: Payer: Self-pay | Admitting: *Deleted

## 2013-10-09 NOTE — Telephone Encounter (Signed)
Patient called and stated that it was the Klonopin. Advised patient that the medication was sent to pharmacy on the 09/28/2013. Patient called the pharmacy and stated that they only received the Flexeril. Called pharmacy to verify information and phoned in medicine

## 2013-10-09 NOTE — Telephone Encounter (Signed)
Patient called and stated that she needs a refill on her medication. Patient failed to leave which medication she needed refilled. Left message on patient voicemail to return call to the office with that information.

## 2013-11-05 ENCOUNTER — Other Ambulatory Visit: Payer: Self-pay | Admitting: Family Medicine

## 2013-11-06 MED ORDER — CLONAZEPAM 1 MG PO TABS: ORAL_TABLET | ORAL | Status: DC

## 2013-11-06 NOTE — Addendum Note (Signed)
Addended by: Arnette NorrisPAYNE, Ash[place] Grundman P on: 11/06/2013 10:16 AM   Modules accepted: Orders

## 2013-11-06 NOTE — Telephone Encounter (Signed)
Rx stamped and faxed      KP 

## 2013-11-06 NOTE — Telephone Encounter (Signed)
Last seen 05/01/13 and filled 09/28/13 #90. Please advise      KP

## 2013-12-03 ENCOUNTER — Other Ambulatory Visit: Payer: Self-pay | Admitting: Family Medicine

## 2013-12-04 ENCOUNTER — Telehealth: Payer: Self-pay | Admitting: *Deleted

## 2013-12-04 NOTE — Telephone Encounter (Signed)
Rx for klonopin faxed to [facility], pt made aware.

## 2013-12-04 NOTE — Telephone Encounter (Signed)
Last seen 05/01/13 and filled 11/06/13 #90. Please advise     KP

## 2014-01-01 ENCOUNTER — Telehealth: Payer: Self-pay

## 2014-01-01 NOTE — Telephone Encounter (Signed)
Last seen 05/01/13 and filled 12/04/13 #90. Please advise     KP

## 2014-01-01 NOTE — Telephone Encounter (Signed)
Refill x1  2 refill 

## 2014-01-02 MED ORDER — CLONAZEPAM 1 MG PO TABS: ORAL_TABLET | ORAL | Status: AC

## 2014-01-02 NOTE — Telephone Encounter (Signed)
Rx faxed and called patient to make her aware that she is now due for an apt. She will need a follow up scheduled when she calls      KP

## 2014-01-17 ENCOUNTER — Ambulatory Visit: Payer: Self-pay | Admitting: Family Medicine

## 2014-01-17 DIAGNOSIS — Z0289 Encounter for other administrative examinations: Secondary | ICD-10-CM
